# Patient Record
Sex: Female | Born: 1970 | Race: White | Hispanic: No | Marital: Married | Smoking: Never smoker
Health system: Southern US, Community
[De-identification: ages and names within clinical notes are randomized; demographics above are authoritative.]

## PROBLEM LIST (undated history)

## (undated) DIAGNOSIS — J310 Chronic rhinitis: Secondary | ICD-10-CM

## (undated) DIAGNOSIS — Q513 Bicornate uterus: Secondary | ICD-10-CM

## (undated) DIAGNOSIS — G47419 Narcolepsy without cataplexy: Secondary | ICD-10-CM

## (undated) DIAGNOSIS — F329 Major depressive disorder, single episode, unspecified: Secondary | ICD-10-CM

## (undated) DIAGNOSIS — F32A Depression, unspecified: Secondary | ICD-10-CM

## (undated) HISTORY — DX: Narcolepsy without cataplexy: G47.419

## (undated) HISTORY — PX: TONSILLECTOMY: SUR1361

## (undated) HISTORY — PX: GASTRIC BYPASS: SHX52

## (undated) HISTORY — DX: Depression, unspecified: F32.A

## (undated) HISTORY — DX: Chronic rhinitis: J31.0

## (undated) HISTORY — DX: Major depressive disorder, single episode, unspecified: F32.9

---

## 1997-01-20 HISTORY — PX: OTHER SURGICAL HISTORY: SHX169

## 1997-06-09 ENCOUNTER — Ambulatory Visit (HOSPITAL_BASED_OUTPATIENT_CLINIC_OR_DEPARTMENT_OTHER): Admission: RE | Admit: 1997-06-09 | Discharge: 1997-06-09 | Payer: Self-pay | Admitting: Otolaryngology

## 1997-10-04 ENCOUNTER — Ambulatory Visit: Admission: RE | Admit: 1997-10-04 | Discharge: 1997-10-04 | Payer: Self-pay | Admitting: Internal Medicine

## 1997-10-04 ENCOUNTER — Encounter: Payer: Self-pay | Admitting: Internal Medicine

## 1997-10-05 ENCOUNTER — Encounter: Payer: Self-pay | Admitting: Internal Medicine

## 1999-05-13 ENCOUNTER — Other Ambulatory Visit: Admission: RE | Admit: 1999-05-13 | Discharge: 1999-05-13 | Payer: Self-pay | Admitting: Obstetrics and Gynecology

## 1999-05-13 ENCOUNTER — Other Ambulatory Visit: Admission: RE | Admit: 1999-05-13 | Discharge: 1999-05-13 | Payer: Self-pay | Admitting: *Deleted

## 1999-07-10 ENCOUNTER — Emergency Department (HOSPITAL_COMMUNITY): Admission: EM | Admit: 1999-07-10 | Discharge: 1999-07-11 | Payer: Self-pay

## 1999-07-11 ENCOUNTER — Encounter: Payer: Self-pay | Admitting: Emergency Medicine

## 1999-08-15 ENCOUNTER — Other Ambulatory Visit: Admission: RE | Admit: 1999-08-15 | Discharge: 1999-08-15 | Payer: Self-pay | Admitting: Obstetrics and Gynecology

## 1999-08-15 ENCOUNTER — Encounter (INDEPENDENT_AMBULATORY_CARE_PROVIDER_SITE_OTHER): Payer: Self-pay

## 2002-07-07 ENCOUNTER — Emergency Department (HOSPITAL_COMMUNITY): Admission: EM | Admit: 2002-07-07 | Discharge: 2002-07-07 | Payer: Self-pay | Admitting: Emergency Medicine

## 2002-11-03 ENCOUNTER — Inpatient Hospital Stay (HOSPITAL_COMMUNITY): Admission: AD | Admit: 2002-11-03 | Discharge: 2002-11-03 | Payer: Self-pay | Admitting: Obstetrics & Gynecology

## 2004-07-05 ENCOUNTER — Ambulatory Visit: Payer: Self-pay | Admitting: Internal Medicine

## 2004-09-26 ENCOUNTER — Ambulatory Visit: Payer: Self-pay | Admitting: Internal Medicine

## 2007-11-02 ENCOUNTER — Observation Stay (HOSPITAL_COMMUNITY): Admission: AD | Admit: 2007-11-02 | Discharge: 2007-11-03 | Payer: Self-pay | Admitting: Obstetrics and Gynecology

## 2007-11-07 ENCOUNTER — Inpatient Hospital Stay (HOSPITAL_COMMUNITY): Admission: AD | Admit: 2007-11-07 | Discharge: 2007-11-07 | Payer: Self-pay | Admitting: Obstetrics and Gynecology

## 2007-12-26 ENCOUNTER — Inpatient Hospital Stay (HOSPITAL_COMMUNITY): Admission: AD | Admit: 2007-12-26 | Discharge: 2007-12-26 | Payer: Self-pay | Admitting: Obstetrics and Gynecology

## 2007-12-30 ENCOUNTER — Inpatient Hospital Stay (HOSPITAL_COMMUNITY): Admission: AD | Admit: 2007-12-30 | Discharge: 2008-01-03 | Payer: Self-pay | Admitting: Obstetrics and Gynecology

## 2007-12-31 ENCOUNTER — Encounter (INDEPENDENT_AMBULATORY_CARE_PROVIDER_SITE_OTHER): Payer: Self-pay | Admitting: Obstetrics and Gynecology

## 2008-01-04 ENCOUNTER — Encounter: Admission: RE | Admit: 2008-01-04 | Discharge: 2008-02-03 | Payer: Self-pay | Admitting: Obstetrics and Gynecology

## 2008-01-06 ENCOUNTER — Inpatient Hospital Stay (HOSPITAL_COMMUNITY): Admission: AD | Admit: 2008-01-06 | Discharge: 2008-01-07 | Payer: Self-pay | Admitting: Obstetrics and Gynecology

## 2008-11-06 ENCOUNTER — Encounter: Admission: RE | Admit: 2008-11-06 | Discharge: 2008-11-06 | Payer: Self-pay | Admitting: Obstetrics and Gynecology

## 2008-11-21 ENCOUNTER — Ambulatory Visit: Payer: Self-pay | Admitting: Family Medicine

## 2008-11-21 DIAGNOSIS — G47419 Narcolepsy without cataplexy: Secondary | ICD-10-CM

## 2008-11-21 DIAGNOSIS — F329 Major depressive disorder, single episode, unspecified: Secondary | ICD-10-CM

## 2008-12-21 ENCOUNTER — Ambulatory Visit: Payer: Self-pay | Admitting: Family Medicine

## 2009-03-14 ENCOUNTER — Telehealth (INDEPENDENT_AMBULATORY_CARE_PROVIDER_SITE_OTHER): Payer: Self-pay | Admitting: *Deleted

## 2009-05-01 ENCOUNTER — Ambulatory Visit: Payer: Self-pay | Admitting: Family Medicine

## 2009-05-01 DIAGNOSIS — R5383 Other fatigue: Secondary | ICD-10-CM

## 2009-05-01 DIAGNOSIS — R5381 Other malaise: Secondary | ICD-10-CM | POA: Insufficient documentation

## 2009-05-01 DIAGNOSIS — E559 Vitamin D deficiency, unspecified: Secondary | ICD-10-CM | POA: Insufficient documentation

## 2009-05-02 LAB — CONVERTED CEMR LAB
ALT: 22 units/L (ref 0–35)
AST: 20 units/L (ref 0–37)
Albumin: 3.6 g/dL (ref 3.5–5.2)
Eosinophils Relative: 0 % (ref 0.0–5.0)
Folate: 17.4 ng/mL
GFR calc non Af Amer: 99.74 mL/min (ref >60)
HCT: 41 % (ref 36.0–46.0)
Hemoglobin: 14 g/dL (ref 12.0–15.0)
LDL Cholesterol: 88 mg/dL (ref 0–99)
Lymphs Abs: 1.5 10*3/uL (ref 0.7–4.0)
Monocytes Relative: 8.1 % (ref 3.0–12.0)
Neutro Abs: 2.3 10*3/uL (ref 1.4–7.7)
Potassium: 3.9 meq/L (ref 3.5–5.1)
RBC: 4.83 M/uL (ref 3.87–5.11)
Sodium: 145 meq/L (ref 135–145)
TSH: 0.54 microintl units/mL (ref 0.35–5.50)
VLDL: 20.6 mg/dL (ref 0.0–40.0)
WBC: 4.2 10*3/uL — ABNORMAL LOW (ref 4.5–10.5)

## 2009-06-05 ENCOUNTER — Ambulatory Visit: Payer: Self-pay | Admitting: Family Medicine

## 2009-07-03 ENCOUNTER — Ambulatory Visit: Payer: Self-pay | Admitting: Family Medicine

## 2009-07-03 DIAGNOSIS — F988 Other specified behavioral and emotional disorders with onset usually occurring in childhood and adolescence: Secondary | ICD-10-CM | POA: Insufficient documentation

## 2009-07-19 ENCOUNTER — Ambulatory Visit: Payer: Self-pay | Admitting: Internal Medicine

## 2009-07-24 ENCOUNTER — Telehealth (INDEPENDENT_AMBULATORY_CARE_PROVIDER_SITE_OTHER): Payer: Self-pay | Admitting: *Deleted

## 2009-07-24 ENCOUNTER — Telehealth: Payer: Self-pay | Admitting: Family Medicine

## 2009-08-16 ENCOUNTER — Ambulatory Visit (HOSPITAL_BASED_OUTPATIENT_CLINIC_OR_DEPARTMENT_OTHER): Admission: RE | Admit: 2009-08-16 | Discharge: 2009-08-16 | Payer: Self-pay | Admitting: Internal Medicine

## 2009-08-16 ENCOUNTER — Encounter: Payer: Self-pay | Admitting: Internal Medicine

## 2009-08-18 ENCOUNTER — Ambulatory Visit: Payer: Self-pay | Admitting: Internal Medicine

## 2009-08-23 ENCOUNTER — Telehealth (INDEPENDENT_AMBULATORY_CARE_PROVIDER_SITE_OTHER): Payer: Self-pay | Admitting: *Deleted

## 2009-08-30 ENCOUNTER — Ambulatory Visit: Payer: Self-pay | Admitting: Internal Medicine

## 2009-08-30 DIAGNOSIS — G4733 Obstructive sleep apnea (adult) (pediatric): Secondary | ICD-10-CM

## 2009-08-30 DIAGNOSIS — J4520 Mild intermittent asthma, uncomplicated: Secondary | ICD-10-CM

## 2009-09-04 ENCOUNTER — Telehealth (INDEPENDENT_AMBULATORY_CARE_PROVIDER_SITE_OTHER): Payer: Self-pay | Admitting: *Deleted

## 2009-10-01 ENCOUNTER — Telehealth (INDEPENDENT_AMBULATORY_CARE_PROVIDER_SITE_OTHER): Payer: Self-pay | Admitting: *Deleted

## 2009-10-03 ENCOUNTER — Encounter: Payer: Self-pay | Admitting: Internal Medicine

## 2009-10-10 ENCOUNTER — Encounter: Payer: Self-pay | Admitting: Internal Medicine

## 2009-12-07 ENCOUNTER — Telehealth (INDEPENDENT_AMBULATORY_CARE_PROVIDER_SITE_OTHER): Payer: Self-pay | Admitting: *Deleted

## 2009-12-28 ENCOUNTER — Ambulatory Visit: Payer: Self-pay | Admitting: Internal Medicine

## 2009-12-31 ENCOUNTER — Telehealth: Payer: Self-pay | Admitting: Internal Medicine

## 2010-01-01 ENCOUNTER — Ambulatory Visit: Payer: Self-pay | Admitting: Family Medicine

## 2010-02-04 ENCOUNTER — Ambulatory Visit
Admission: RE | Admit: 2010-02-04 | Discharge: 2010-02-04 | Payer: Self-pay | Source: Home / Self Care | Attending: Family Medicine | Admitting: Family Medicine

## 2010-02-13 ENCOUNTER — Other Ambulatory Visit: Payer: Self-pay | Admitting: Dermatology

## 2010-02-15 ENCOUNTER — Ambulatory Visit
Admission: RE | Admit: 2010-02-15 | Discharge: 2010-02-15 | Payer: Self-pay | Source: Home / Self Care | Attending: Family Medicine | Admitting: Family Medicine

## 2010-02-19 NOTE — Progress Notes (Signed)
Summary: Wellbutrin  Phone Note Call from Patient Call back at Home Phone [phone]   Caller: Patient Summary of Call: Pt states that she feels the Wellbutrin works better for her than the Pristiq. Please advise. Army Fossa CMA  July 24, 2009 5:00 PM   Follow-up for Phone Call        ok to start on wellbutrin xl 150  #60  1 by mouth once daily for 1 week then 2 by mouth once daily  wean off pristiq--- 50 mg 1 by mouth once daily for 2 weeks---can give samples Follow-up by: Loreen Freud DO,  July 24, 2009 5:03 PM  Additional Follow-up for Phone Call Additional follow up Details #1::        left message for pt to call back. Army Fossa CMA  July 25, 2009 8:21 AM     Additional Follow-up for Phone Call Additional follow up Details #2::    Spoke with pt she is aware, will put samples up front for pt. Army Fossa CMA  July 25, 2009 11:09 AM   New/Updated Medications: WELLBUTRIN XL 150 MG XR24H-TAB (BUPROPION HCL) 1 by mouth once daily for 1 week then 2 by mouth once daily Prescriptions: WELLBUTRIN XL 150 MG XR24H-TAB (BUPROPION HCL) 1 by mouth once daily for 1 week then 2 by mouth once daily  #60 x 0   Entered by:   Army Fossa CMA   Authorized by:   Loreen Freud DO   Signed by:   Army Fossa CMA on 07/25/2009   Method used:   Electronically to        [facility]       [address]       [place], [place]  [address]       Ph: [phone]       Fax: [phone]   RxID:   [id number]

## 2010-02-19 NOTE — Medication Information (Signed)
Summary: Prior [facility] for Nuvigil/[place] [facility]  Prior [facility] for Nuvigil/[place] [facility]   Imported By: Sherian Rein 10/04/2009 14:40:18  _____________________________________________________________________  External Attachment:    Type:   Image     Comment:   External Document

## 2010-02-19 NOTE — Progress Notes (Signed)
Summary: refill  Phone Note Refill Request Message from:  Patient on July 24, 2009 2:35 PM  Refills Requested: Medication #1:  VYVANSE 70 MG CAPS 1 by mouth once daily patient will pick up [id number]  Initial call taken by: Okey Regal Spring,  July 24, 2009 2:36 PM  Follow-up for Phone Call        Fine for pt to pick up on 07/26/09, but no earlier. Army Fossa CMA  July 24, 2009 2:37 PM     Prescriptions: VYVANSE 70 MG CAPS (LISDEXAMFETAMINE DIMESYLATE) 1 by mouth once daily  #30 x 0   Entered by:   Army Fossa CMA   Authorized by:   Loreen Freud DO   Signed by:   Army Fossa CMA on 07/24/2009   Method used:   Print then Give to Patient   RxID:   [id number]

## 2010-02-19 NOTE — Progress Notes (Signed)
  Phone Note Refill Request   Refills Requested: Medication #1:  ADDERALL 30 MG TABS 1 by mouth two times a day  Follow-up for Phone Call        left message that rx would  be ready tomorrow a.m.Army Fossa CMA  March 14, 2009 3:36 PM     Prescriptions: ADDERALL 30 MG TABS (AMPHETAMINE-DEXTROAMPHETAMINE) 1 by mouth two times a day  #60 x 0   Entered by:   Army Fossa CMA   Authorized by:   Loreen Freud DO   Signed by:   Army Fossa CMA on 03/14/2009   Method used:   Print then Give to Patient   RxID:   [id number]

## 2010-02-19 NOTE — Progress Notes (Signed)
Summary: samples  Phone Note Call from Patient Call back at Home Phone [phone]   Caller: Patient Call For: young Reason for Call: Talk to Nurse Summary of Call: CDY wanted pt to try Nuvigil 250 until next appt.  She comes in next weel  Can she get more samples.  She's out. Initial call taken by: Eugene Gavia,  August 23, 2009 9:23 AM  Follow-up for Phone Call        [facility] to give samples to last until ov next week?  Please advise. Abigail Miyamoto RN  August 23, 2009 9:46 AM   Additional Follow-up for Phone Call Additional follow up Details #1::        Ok to give one sample card of Nuvigil 250 mg, 1 daily as needed. Additional Follow-up by: Waymon Budge MD,  August 23, 2009 1:32 PM    Additional Follow-up for Phone Call Additional follow up Details #2::    LMOM that sample was left at front desk. Abigail Miyamoto RN  August 23, 2009 1:43 PM

## 2010-02-19 NOTE — Progress Notes (Signed)
Summary: prescript  Phone Note Call from Patient   Caller: Patient Call For: young Summary of Call: need prescription for nuvigil . she had samples only [facility] Initial call taken by: Rickard Patience,  July 24, 2009 2:32 PM  Follow-up for Phone Call        Spoke with pt and she states that the nuvigil 150 mg 1 every am.  She states the nuvigil has been helping with alertness but still feels like she has no energy.  She wants to know if she should increase the dose, or try new med? Please advise, thanks! Follow-up by: Vernie Murders,  July 24, 2009 3:18 PM  Additional Follow-up for Phone Call Additional follow up Details #1::        She can pick up samples Nuvigil 250  mg to try one daily before script. i am a little concerned about combining too many meds. She also needs to see if insurance will cover. i won't deal with prior auth. Additional Follow-up by: Waymon Budge MD,  July 25, 2009 8:52 AM    Additional Follow-up for Phone Call Additional follow up Details #2::    samples are out front but pt needs to call ins or get formulary to see if med is covered --cy states no PA will be done--med will not go through on PA--lmtcb   Philipp Deputy [facility]  July 25, 2009 9:46 AM   Pt aware and will need to obtain formulary and bring to her next OV.Michel Bickers [employer]  July 26, 2009 11:43 AM

## 2010-02-19 NOTE — Assessment & Plan Note (Signed)
Summary: NARCOLEPSY W/ OUT CATA PLEXY//kp   Primary Provider/Referring Provider:  Laury Axon  CC:  Sleep Consult-Dr. Laury Axon.  History of Present Illness: July 19, 2009- 40 yoF seen at kind request of Dr Laury Axon. 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 Never successfull before with alerting meds, now including adderall and Vyvanse. In last year, son dx'd liver cancer and she has been stressed and depressed. With that she has put on 50 lbs. Husband tells her she snores, stops breathing.  Naps not refreshing. She tried father's cpap machine and thought it made her feel a little better. Naps don't help but takes more than one daily. Bedtime 7-9PM, then takes 1-2hurs to fall asleep, wakes 3-4 times briefly for bathroom, then up 9-11AM. Questionable sleep paralysis. Denies cataplexy, hynagogic hallucination, limb jerks, sleep walking. Some allergic nasal stufiness aggravates snoring at times. Hx asthma. Denies thyroid disease. Tonsils out.  Preventive Screening-Counseling & Management  Alcohol-Tobacco     Smoking Status: never  Current Medications (verified): 1)  Pristiq 100 Mg Xr24h-Tab (Desvenlafaxine Succinate) .Marland Kitchen.. 1 By Mouth Once Daily 2)  Vyvanse 70 Mg Caps (Lisdexamfetamine Dimesylate) .Marland Kitchen.. 1 By Mouth Once Daily 3)  Abilify 10 Mg Tabs (Aripiprazole) .... 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  The patient denies shortness of breath at rest, productive cough, non-productive cough, coughing up blood, chest pain, irregular heartbeats, acid heartburn, indigestion, loss of appetite, abdominal pain, difficulty swallowing, sore throat, tooth/dental problems, itching, ear ache, anxiety, hand/feet swelling, joint stiffness or pain, rash, change in color of mucus, and fever.    Vital Signs:  Patient profile:   40 year old female Height:      68 inches Weight:      202 pounds BMI:     30.83 O2 Sat:      100 % on Room air Pulse rate:   102 / minute BP sitting:   112 / 78  (right arm) Cuff size:   regular  Vitals Entered By: Reynaldo Minium CMA (July 19, 2009 10:24 AM)  O2 Flow:  Room air CC: Sleep Consult-Dr. 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 Her son's recurrent cancer is the dominant issue in her life. She may benefit from  addition of Nuvigil. Discussed med interaction. She will continue to need naps.  Problem # 2:  ? of OBSTRUCTIVE SLEEP APNEA (ICD-327.23)  As she has gained weight she has snored worse and may have develoeped sleep apnea which would increase her daytime sleepiness. We will get NPSG to assess this.  Medications Added to Medication List This Visit: 1)  Abilify 10 Mg Tabs (Aripiprazole) .... Take 1 by mouth once daily  Other Orders: Consultation Level IV (16109) Sleep Disorder Referral (Sleep Disorder)  Patient Instructions: 1)  Please schedule a follow-up appointment in 1 month. 2)  See [facility] to schedule sleep study 3)  Try sample Nuvigil 150 mg, once daily in the morning. 4)  Copy sent to: Dr Laury Axon

## 2010-02-19 NOTE — Assessment & Plan Note (Signed)
Summary: rto 1 month/cbs   Vital Signs:  Patient profile:   40 year old female Weight:      201.25 pounds Pulse rate:   90 / minute Pulse rhythm:   regular BP sitting:   124 / 80  (left arm) Cuff size:   regular  Vitals Entered By: Army Fossa CMA (July 03, 2009 3:01 PM) CC: Pt here for follow up- states the adipex is not working thinks its due to stress.    History of Present Illness: Pt here for f/u.  She doesn't feel like the adipex is helping.  She states the best she ever felt was when she was on vyvanse and wellbutrin together.   Current Medications (verified): 1)  Pristiq 100 Mg Xr24h-Tab (Desvenlafaxine Succinate) .Marland Kitchen.. 1 By Mouth Once Daily 2)  Vyvanse 70 Mg Caps (Lisdexamfetamine Dimesylate) .Marland Kitchen.. 1 By Mouth Once Daily  Allergies (verified): No Known Drug Allergies  Past History:  Past medical, surgical, family and social histories (including risk factors) reviewed for relevance to current acute and chronic problems.  Past Medical History: Reviewed history from 11/21/2008 and no changes required. Depression   Family History: Reviewed history from 11/21/2008 and no changes required. Family History Breast cancer 1st degree relative <50 Family History Depression  Social History: Reviewed history from 11/21/2008 and no changes required. Married Never Smoked Alcohol use-no Drug use-no G1P1  Review of Systems      See HPI  Physical Exam  General:  Well-developed,well-nourished,in no acute distress; alert,appropriate and cooperative throughout examination Lungs:  Normal respiratory effort, chest expands symmetrically. Lungs are clear to auscultation, no crackles or wheezes. Heart:  normal rate and no murmur.   Psych:  Oriented X3, normally interactive, good eye contact, and tearful.     Impression & Recommendations:  Problem # 1:  DEPRESSIVE DISORDER (ICD-311)  Her updated medication list for this problem includes:    Pristiq 100 Mg Xr24h-tab  (Desvenlafaxine succinate) .Marland Kitchen... 1 by mouth once daily  Problem # 2:  ATTENTION DEFICIT DISORDER, ADULT (ICD-314.00) refill vyvanse 70 mg  Complete Medication List: 1)  Pristiq 100 Mg Xr24h-tab (Desvenlafaxine succinate) .Marland Kitchen.. 1 by mouth once daily 2)  Vyvanse 70 Mg Caps (Lisdexamfetamine dimesylate) .Marland Kitchen.. 1 by mouth once daily Prescriptions: VYVANSE 70 MG CAPS (LISDEXAMFETAMINE DIMESYLATE) 1 by mouth once daily  #30 x 0   Entered and Authorized by:   Loreen Freud DO   Signed by:   Loreen Freud DO on 07/03/2009   Method used:   Print then Give to Patient   RxID:   [id number]

## 2010-02-19 NOTE — Progress Notes (Signed)
Summary: samples  Phone Note Call from Patient Call back at Home Phone [phone]   Caller: Patient Call For: young Summary of Call: Mary [name], pt is waiting to get her ins- should have it in a few weeks but requests samples of nuvigil in the meantime.  Initial call taken by: Tivis Ringer, CNA,  September 04, 2009 12:15 PM  Follow-up for Phone Call        ATC pt-samples at front desk for pick up-unable to leave message as the voicemail is full.Reynaldo Minium CMA  September 04, 2009 2:08 PM   ATC pt at home #.  NA and unable to leave a message- voicemail box is full.  however, i was able to leave a call back # for pt to call back.  Aundra Millet Reynolds LPN  September 04, 2009 3:50 PM    ATC pt at number given; was able to leave our office number via pager system-voicemail full.Reynaldo Minium CMA  September 05, 2009 9:54 AM   Additional Follow-up for Phone Call Additional follow up Details #1::        ATC pt at home #.  NA and unable to leave a message-VM is full and cannot accept new messages at this time however, was able to leave a call back # for the pt via pager.  Gweneth Dimitri RN  September 05, 2009 11:04 AM     Additional Follow-up for Phone Call Additional follow up Details #2::    Pt returned called.  Informed her there are samples of nuvigil at front desk to pick up--she verbalized understanding  Follow-up by: Gweneth Dimitri RN,  September 05, 2009 12:00 PM

## 2010-02-19 NOTE — Assessment & Plan Note (Signed)
Summary: talk about med//lch   Vital Signs:  Patient profile:   40 year old female Weight:      203 pounds BMI:     30.98 Pulse rate:   88 / minute Pulse rhythm:   regular BP sitting:   122 / 80  (left arm) Cuff size:   regular  Vitals Entered By: Army Fossa CMA (Jun 05, 2009 2:01 PM) CC: Pt here to discuss weight loss meds.    History of Present Illness: Pt here to discuss weight loss.  Pt has gained a lot of weight since her son got sick.    Preventive Screening-Counseling & Management  Alcohol-Tobacco     Alcohol drinks/day: 0     Smoking Status: never  Caffeine-Diet-Exercise     Caffeine use/day: 2     Does Patient Exercise: yes     Type of exercise: walking      Times/week: <3      Sexual History:  currently monogamous.    Current Medications (verified): 1)  Adderall 30 Mg Tabs (Amphetamine-Dextroamphetamine) .Marland Kitchen.. 1 By Mouth Two Times A Day 2)  Pristiq 100 Mg Xr24h-Tab (Desvenlafaxine Succinate) .Marland Kitchen.. 1 By Mouth Once Daily 3)  Abilify 5 Mg Tabs (Aripiprazole) .Marland Kitchen.. 1 By Mouth Qd 4)  Adipex-P 37.5 Mg Tabs (Phentermine Hcl) .Marland Kitchen.. 1 By Mouth Qam  Allergies (verified): No Known Drug Allergies  Social History: Does Patient Exercise:  yes Sexual History:  currently monogamous  Physical Exam  General:  Well-developed,well-nourished,in no acute distress; alert,appropriate and cooperative throughout examination Lungs:  Normal respiratory effort, chest expands symmetrically. Lungs are clear to auscultation, no crackles or wheezes. Heart:  normal rate and no murmur.   Psych:  Oriented X3 and normally interactive.     Impression & Recommendations:  Problem # 1:  MORBID OBESITY (ICD-278.01) adipex 1 by mouth once daily  d/w pt diet and exercise  Ht: 68 (12/21/2008)   Wt: 203 (06/05/2009)   BMI: 30.98 (06/05/2009)  Complete Medication List: 1)  Pristiq 100 Mg Xr24h-tab (Desvenlafaxine succinate) .Marland Kitchen.. 1 by mouth once daily 2)  Abilify 5 Mg Tabs (Aripiprazole)  .Marland Kitchen.. 1 by mouth qd 3)  Adipex-p 37.5 Mg Tabs (Phentermine hcl) .Marland Kitchen.. 1 by mouth qam  Other Orders: Pulmonary Referral (Pulmonary)  Patient Instructions: 1)  Please schedule a follow-up appointment in 1 month.  Prescriptions: ADIPEX-P 37.5 MG TABS (PHENTERMINE HCL) 1 by mouth qam  #30 x 0   Entered and Authorized by:   Loreen Freud DO   Signed by:   Loreen Freud DO on 06/05/2009   Method used:   Print then Give to Patient   RxID:   [id number]

## 2010-02-19 NOTE — Progress Notes (Signed)
Summary: Nuvigil PA----APPROVED  Phone Note Outgoing Call   Call placed by: Michel Bickers CMA,  October 01, 2009 10:40 AM Call placed to: [facility] [phone]                                Summary of Call: PA initiated through [facility] at [phone] for Nuvigil. There are no other preferred medications on the patients ins plan. Nuvigil form will be faxed. [place] fax.  Form received and placed on CDY cart.Michel Bickers [facility]  October 01, 2009 11:43 AM Initial call taken by: Michel Bickers CMA,  October 01, 2009 10:42 AM  Follow-up for Phone Call        i did not find form on CDY's cart and spoke with Florentina Addison who has not seen this.  will forward to CDY and Katie for follow up. Boone Master CNA/MA  October 03, 2009 4:48 PM   Approval received today, 10/04/2009 for Nuvigil. Will inform pharmacy and the patient.Michel Bickers [facility]  October 04, 2009 9:30 AM

## 2010-02-19 NOTE — Medication Information (Signed)
Summary: Tax adviser   Imported By: Lehman Prom 10/10/2009 15:58:43  _____________________________________________________________________  External Attachment:    Type:   Image     Comment:   External Document

## 2010-02-19 NOTE — Progress Notes (Signed)
Summary: refill  Phone Note Refill Request Call back at Home Phone [phone] Message from:  Patient on December 07, 2009 12:56 PM  Refills Requested: Medication #1:  VYVANSE 70 MG CAPS 1 by mouth once daily   Dosage confirmed as above?Dosage Confirmed   Supply Requested: 1 month pt would like to pick up around 3 pm if possible. please call her and let her know when it is ready.  Initial call taken by: Lavell Islam,  December 07, 2009 12:57 PM    Prescriptions: VYVANSE 70 MG CAPS (LISDEXAMFETAMINE DIMESYLATE) 1 by mouth once daily  #30 x 0   Entered by:   Almeta Monas CMA (AAMA)   Authorized by:   Loreen Freud DO   Signed by:   Almeta Monas CMA (AAMA) on 12/07/2009   Method used:   Print then Give to Patient   RxID:   [id number]

## 2010-02-19 NOTE — Assessment & Plan Note (Signed)
Summary: TIRED/REQUESTING BLOODWORK/KDC   Vital Signs:  Patient profile:   40 year old female Weight:      197 pounds Pulse rate:   86 / minute Pulse rhythm:   regular BP sitting:   126 / 80  (left arm) Cuff size:   regular  Vitals Entered By: Army Fossa CMA (May 01, 2009 9:55 AM) CC: Pt here requesting bloodwork, does not feel the Pristiq is working.    History of Present Illness: Pt here f/u depression/ anxiety--- she feels the pristiq is not working but has been on several antidepressants in past that have not worked as well.  57 mon old with a form of liver ca---AFP is elevated again.  Pt has been exhausted and is requesting blood work.  She has hx vita D deficiency.    Current Medications (verified): 1)  Adderall 30 Mg Tabs (Amphetamine-Dextroamphetamine) .Marland Kitchen.. 1 By Mouth Two Times A Day 2)  Xanax 1 Mg Tabs (Alprazolam) .Marland Kitchen.. 1 By Mouth Three Times A Day As Needed 3)  Ambien 5 Mg Tabs (Zolpidem Tartrate) 4)  Pristiq 100 Mg Xr24h-Tab (Desvenlafaxine Succinate) .Marland Kitchen.. 1 By Mouth Once Daily 5)  Abilify 5 Mg Tabs (Aripiprazole) .Marland Kitchen.. 1 By Mouth Qd  Allergies (verified): No Known Drug Allergies  Past History:  Past medical, surgical, family and social histories (including risk factors) reviewed for relevance to current acute and chronic problems.  Past Medical History: Reviewed history from 11/21/2008 and no changes required. Depression   Family History: Reviewed history from 11/21/2008 and no changes required. Family History Breast cancer 1st degree relative <50 Family History Depression  Social History: Reviewed history from 11/21/2008 and no changes required. Married Never Smoked Alcohol use-no Drug use-no G1P1  Review of Systems      See HPI  Physical Exam  General:  Well-developed,well-nourished,in no acute distress; alert,appropriate and cooperative throughout examination Psych:  Oriented X3, depressed affect, and tearful.     Impression &  Recommendations:  Problem # 1:  DEPRESSIVE DISORDER (ICD-311)  Her updated medication list for this problem includes:    Xanax 1 Mg Tabs (Alprazolam) .Marland Kitchen... 1 by mouth three times a day as needed    Pristiq 100 Mg Xr24h-tab (Desvenlafaxine succinate) .Marland Kitchen... 1 by mouth once daily  Orders: Venipuncture (16109) TLB-B12 + Folate Pnl (60454_09811-B14/NWG) TLB-Lipid Panel (80061-LIPID) TLB-BMP (Basic Metabolic Panel-BMET) (80048-METABOL) TLB-CBC Platelet - w/Differential (85025-CBCD) TLB-Hepatic/Liver Function Pnl (80076-HEPATIC) TLB-TSH (Thyroid Stimulating Hormone) (84443-TSH) T-Vitamin D (25-Hydroxy) (95621-30865)  Problem # 2:  FATIGUE (ICD-780.79)  Orders: Venipuncture (78469) TLB-B12 + Folate Pnl (62952_84132-G40/NUU) TLB-Lipid Panel (80061-LIPID) TLB-BMP (Basic Metabolic Panel-BMET) (80048-METABOL) TLB-CBC Platelet - w/Differential (85025-CBCD) TLB-Hepatic/Liver Function Pnl (80076-HEPATIC) TLB-TSH (Thyroid Stimulating Hormone) (84443-TSH) T-Vitamin D (25-Hydroxy) (72536-64403)  Problem # 3:  UNSPECIFIED VITAMIN D DEFICIENCY (ICD-268.9)  Orders: Venipuncture (47425) TLB-B12 + Folate Pnl (95638_75643-P29/JJO) TLB-Lipid Panel (80061-LIPID) TLB-BMP (Basic Metabolic Panel-BMET) (80048-METABOL) TLB-CBC Platelet - w/Differential (85025-CBCD) TLB-Hepatic/Liver Function Pnl (80076-HEPATIC) TLB-TSH (Thyroid Stimulating Hormone) (84443-TSH) T-Vitamin D (25-Hydroxy) (84166-06301)  Complete Medication List: 1)  Adderall 30 Mg Tabs (Amphetamine-dextroamphetamine) .Marland Kitchen.. 1 by mouth two times a day 2)  Xanax 1 Mg Tabs (Alprazolam) .Marland Kitchen.. 1 by mouth three times a day as needed 3)  Ambien 5 Mg Tabs (Zolpidem tartrate) 4)  Pristiq 100 Mg Xr24h-tab (Desvenlafaxine succinate) .Marland Kitchen.. 1 by mouth once daily 5)  Abilify 5 Mg Tabs (Aripiprazole) .Marland Kitchen.. 1 by mouth qd Prescriptions: ABILIFY 5 MG TABS (ARIPIPRAZOLE) 1 by mouth qd  #30 x 2   Entered and Authorized by:  Loreen Freud DO   Signed by:    Loreen Freud DO on 05/01/2009   Method used:   Print then Give to Patient   RxID:   [id number] PRISTIQ 100 MG XR24H-TAB (DESVENLAFAXINE SUCCINATE) 1 by mouth once daily  #30 x 5   Entered and Authorized by:   Loreen Freud DO   Signed by:   Loreen Freud DO on 05/01/2009   Method used:   Print then Give to Patient   RxID:   [id number] ADDERALL 30 MG TABS (AMPHETAMINE-DEXTROAMPHETAMINE) 1 by mouth two times a day  #60 x 0   Entered and Authorized by:   Loreen Freud DO   Signed by:   Loreen Freud DO on 05/01/2009   Method used:   Print then Give to Patient   RxID:   [id number]

## 2010-02-19 NOTE — Assessment & Plan Note (Signed)
Summary: rov ///kp   Primary Provider/Referring Provider:  Laury Axon  CC:  follow up visit-still not sleeping good; always tired.Marland Kitchen  History of Present Illness:  July 19, 2009- 38 yoF seen at kind request of Dr Laury Axon. 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 Never successfull before with alerting meds, now including adderall and Vyvanse. In last year, son dx'd liver cancer and she has been stressed and depressed. With that she has put on 50 lbs. Husband tells her she snores, stops breathing.  Naps not refreshing. She tried father's cpap machine and thought it made her feel a little better. Naps don't help but takes more than one daily. Bedtime 7-9PM, then takes 1-2 hours to fall asleep, wakes 3-4 times briefly for bathroom, then up 9-11AM. Questionable sleep paralysis. Denies cataplexy, hynagogic hallucination, limb jerks, sleep walking. Some allergic nasal stufiness aggravates snoring at times. Hx asthma. Denies thyroid disease. Tonsils out.  August 30, 2009- Narcolepsy w/o cataplexy, Hx UPPP, Asthma Tired- her child kept her up last night. Expects some opportunity to nap. Likes Nuvigil 250, better than meds previously tried, but still wishes she could get "more energy". Asks about meds to boost metabolism and I mentioned caffeine. She is asking as much for weight loss purposes as for alertness and we discussed this. NPSG- minimal apnea AHI 5.7/ hr, RDI 12.6. We discussed chin straps, weight loss, side-sleeping and nasal strips as conservative options. An oral appliance may have some potential. Hx of mild intermittent asthma and she asks me to refill rescue inhaler- used infrequently.  Asthma History    Initial Asthma Severity Rating:    Age range: 12+ years    Symptoms: 0-2 days/week    Nighttime Awakenings: 0-2/month    Interferes w/ normal activity: no limitations    SABA use (not for EIB): 0-2 days/week    Asthma Severity Assessment: Intermittent   Preventive Screening-Counseling &  Management  Alcohol-Tobacco     Alcohol drinks/Bernardina Cacho: 0     Smoking Status: never  Current Medications (verified): 1)  Pristiq 100 Mg Xr24h-Tab (Desvenlafaxine Succinate) .Marland Kitchen.. 1 By Mouth Once Daily 2)  Vyvanse 70 Mg Caps (Lisdexamfetamine Dimesylate) .Marland Kitchen.. 1 By Mouth Once Daily 3)  Abilify 10 Mg Tabs (Aripiprazole) .... Take 1 By Mouth Once Daily 4)  Wellbutrin Xl 150 Mg Xr24h-Tab (Bupropion Hcl) .Marland Kitchen.. 1 By Mouth Once Daily For 1 Week Then 2 By Mouth Once Daily 5)  Proventil Hfa 108 (90 Base) Mcg/act Aers (Albuterol Sulfate) .... 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 08/16/09 AHI 5.7/ RDI 12.6 Rhinitis Asthma  Past Surgical History: Tonsillectomy UPPP for OSA- 1999   Review of Systems      See HPI  The patient denies anorexia, fever, weight loss, weight gain, vision loss, decreased hearing, hoarseness, chest pain, syncope, dyspnea on exertion, peripheral edema, prolonged cough, headaches, hemoptysis, abdominal pain, melena, and severe indigestion/heartburn.    Vital Signs:  Patient profile:   40 year old female Height:      68 inches Weight:  207.13 pounds BMI:     31.61 O2 Sat:      98 % on Room air Pulse rate:   82 / minute BP sitting:   114 / 68  (right arm) Cuff size:   regular  Vitals Entered By: Reynaldo Minium CMA (August 30, 2009 10:26 AM)  O2 Flow:  Room air CC: follow up visit-still not  sleeping good; always tired.   Physical Exam  Additional Exam:  General: A/Ox3; pleasant and cooperative, NAD, overweight SKIN: no rash, lesions NODES: no lymphadenopathy HEENT: [place]/AT, EOM- WNL, Conjuctivae- clear, PERRLA, TM-WNL, Nose- clear, Throat- s/p UPPP NECK: Supple w/ fair ROM, JVD- none, normal carotid impulses w/o bruits Thyroid- normal to palpation CHEST: Clear to P&A HEART: RRR, no m/g/r heard ABDOMEN: Soft and nl; ZOX:WRUE, nl pulses, no edema  NEURO: Grossly intact to observation. Does not apear sleepy.      Impression & Recommendations:  Problem # 1:  ? of OBSTRUCTIVE SLEEP APNEA (ICD-327.23)  Minimal apnea now based on current NPSG, with some weight gain and with past hx of palatoplasty. We might be able to justify a trial of CPAP later, based on her RDI. I would prefer to be conservative and have suggested a chin strap and nasal strips as inexpensive options, working on weight.  Problem # 2:  NARCOLEPSY WITHOUT CATAPLEXY (ICD-347.00)  She will continue to nap when able. Meanwhile we will refill Nuvigil.  I discussed occasional, gentle supplementation with caffeine tab if needed.  Problem # 3:  ASTHMA (ICD-493.90) She has hx of asthma and asks we refill her rescue inhaler for appropriate use.  Medications Added to Medication List This Visit: 1)  Proventil Hfa 108 (90 Base) Mcg/act Aers (Albuterol sulfate) .... 2 puffs four times a Yoselyn Mcglade as needed 2)  Nuvigil 250 Mg Tabs (Armodafinil) .Marland Kitchen.. 1 daily if needed  Other Orders: Est. Patient Level IV (45409)  Patient Instructions: 1)  Please schedule a follow-up appointment in 4 months. 2)  Consider trying chin strap and/ or nasal strips if needed to help with minimal sleep apnea and snoring. 3)  Scripts for [employer] and proventil 4)  Consider occasional use of caffeine caplets- NODOZ or store brand Prescriptions: NUVIGIL 250 MG TABS (ARMODAFINIL) 1 daily if needed  #30 x 5   Entered and Authorized by:   Waymon Budge MD   Signed by:   Waymon Budge MD on 08/30/2009   Method used:   Print then Give to Patient   RxID:   [id number] PROVENTIL HFA 108 (90 BASE) MCG/ACT AERS (ALBUTEROL SULFATE) 2 puffs four times a Jazmina Muhlenkamp as needed  #1 x prn   Entered and Authorized by:   Waymon Budge MD   Signed by:   Waymon Budge MD on 08/30/2009   Method used:   Print then Give to Patient   RxID:   [id number]

## 2010-02-21 NOTE — Assessment & Plan Note (Signed)
Summary: flu shot//lch  Nurse Visit Flu Vaccine Consent Questions     Do you have a history of severe allergic reactions to this vaccine? no    Any prior history of allergic reactions to egg and/or gelatin? no    Do you have a sensitivity to the preservative Thimersol? no    Do you have a past history of Guillan-Barre Syndrome? no    Do you currently have an acute febrile illness? no    Have you ever had a severe reaction to latex? no    Vaccine information given and explained to patient? yes    Are you currently pregnant? no    Lot Number:[id number]   Exp Date:07/20/2010   Site Given  Left Deltoid IM   Allergies: No Known Drug Allergies  Orders Added: 1)  Admin 1st Vaccine [90471] 2)  Flu Vaccine 80yrs + [69629]

## 2010-02-21 NOTE — Assessment & Plan Note (Signed)
Summary: 6 MONTH RTO/KN   Vital Signs:  Patient profile:   40 year old female Weight:      198.8 pounds Pulse rate:   80 / minute Pulse rhythm:   regular BP sitting:   102 / 64  (right arm) Cuff size:   large  Vitals Entered By: Almeta Monas CMA Duncan Dull) (February 04, 2010 10:40 AM) CC: 6 mo f/u on meds---c/o increased stress and headaches   History of Present Illness: Pt here to f/u meds.  Pt states she is still very depressed.  She is only taking wellbutrin xl 150 mg.   Her son's cancer is back and she has been under a lot of stress.    Current Medications (verified): 1)  Vyvanse 70 Mg Caps (Lisdexamfetamine Dimesylate) .Marland Kitchen.. 1 By Mouth Once Daily 2)  Wellbutrin Xl 300 Mg Xr24h-Tab (Bupropion Hcl) .Marland Kitchen.. 1 By Mouth Once Daily 3)  Proventil Hfa 108 (90 Base) Mcg/act Aers (Albuterol Sulfate) .... 2 Puffs Four Times A Day As Needed 4)  Xanax 1 Mg Tabs (Alprazolam) .Marland Kitchen.. 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 08/16/09 AHI 5.7/ RDI 12.6 Rhinitis Asthma  Past Surgical History: Last updated: 08/30/2009 Tonsillectomy UPPP for OSA- 1999   Family History: Last updated: 07/19/2009 Family History Breast cancer 1st degree relative <50 Family History Depression Son 35 months old- hepatoblastoma  Social History: Last updated: 11/21/2008 Married Never Smoked Alcohol use-no Drug use-no G1P1  Risk Factors: Alcohol Use: 0 (08/30/2009) Caffeine Use: 2 (06/05/2009) Exercise: yes (06/05/2009)  Risk Factors: Smoking Status: never (08/30/2009)  Family History: Reviewed history from 07/19/2009 and no changes required. Family History Breast cancer 1st degree relative <50 Family History Depression Son 30 months old- hepatoblastoma  Social History: Reviewed history from 11/21/2008 and no  changes required. Married Never Smoked Alcohol use-no Drug use-no G1P1  Review of Systems      See HPI  Physical Exam  General:  Well-developed,well-nourished,in no acute distress; alert,appropriate and cooperative throughout examination Psych:  Oriented X3, normally interactive, good eye contact, not anxious appearing, not depressed appearing, and not suicidal.     Impression & Recommendations:  Problem # 1:  DEPRESSIVE DISORDER (ICD-311)  The following medications were removed from the medication list:    Pristiq 100 Mg Xr24h-tab (Desvenlafaxine succinate) .Marland Kitchen... 1 by mouth once daily Her updated medication list for this problem includes:    Wellbutrin Xl 300 Mg Xr24h-tab (Bupropion hcl) .Marland Kitchen... 1 by mouth once daily    Xanax 1 Mg Tabs (Alprazolam) .Marland Kitchen... 1 by mouth three times a day as needed  Complete Medication List: 1)  Vyvanse 70 Mg Caps (Lisdexamfetamine dimesylate) .Marland Kitchen.. 1 by mouth once daily 2)  Wellbutrin Xl 300 Mg Xr24h-tab (Bupropion hcl) .Marland Kitchen.. 1 by mouth once daily 3)  Proventil Hfa 108 (90 Base) Mcg/act Aers (Albuterol sulfate) .... 2 puffs four times a day as needed 4)  Xanax 1 Mg Tabs (Alprazolam) .Marland Kitchen.. 1 by mouth three times a day as needed  Patient Instructions: 1)  Please schedule a follow-up appointment in 6 months .  Prescriptions: XANAX 1 MG TABS (ALPRAZOLAM) 1 by mouth three times a day as needed  #60 x 1   Entered and Authorized by:   Loreen Freud DO   Signed by:   Loreen Freud DO on 02/04/2010   Method  used:   Print then Give to Patient   RxID:   [id number] VYVANSE 70 MG CAPS (LISDEXAMFETAMINE DIMESYLATE) 1 by mouth once daily  #30 x 0   Entered and Authorized by:   Loreen Freud DO   Signed by:   Loreen Freud DO on 02/04/2010   Method used:   Print then Give to Patient   RxID:   [id number] WELLBUTRIN XL 300 MG XR24H-TAB (BUPROPION HCL) 1 by mouth once daily  #30 x 5   Entered and Authorized by:   Loreen Freud DO   Signed by:   Loreen Freud  DO on 02/04/2010   Method used:   Electronically to        [facility]       [address]       [place], [place]  [address]       Ph: [phone]       Fax: [phone]   RxID:   [id number]    Orders Added: 1)  Est. Patient Level III [29937]

## 2010-02-21 NOTE — Progress Notes (Signed)
Summary: nos appt  Phone Note Call from Patient   Caller: [email or web address]  Call For: young Summary of Call: LMTCB x2 to rsc nos from 12/9. Initial call taken by: Darletta Moll,  December 31, 2009 3:35 PM

## 2010-02-26 ENCOUNTER — Telehealth: Payer: Self-pay | Admitting: Family Medicine

## 2010-03-07 NOTE — Progress Notes (Signed)
Summary: med increase  Phone Note Refill Request Call back at Home Phone [phone] Message from:  Patient  Refills Requested: Medication #1:  WELLBUTRIN XL 300 MG XR24H-TAB 1 by mouth once daily Pt called to report that med is working but feels that she may just need something a little stronger. Pt notes that med is working but on some days she is just needing a little extra to help her thru the day because of her current situation with son. [facility] uses [facility]. Pls advise.........Marland KitchenFelecia Deloach CMA  February 26, 2010 11:45 AM    Follow-up for Phone Call        wellbutrin xl 150 mg  3 by mouth once daily #90  2 refills Follow-up by: Loreen Freud DO,  February 26, 2010 11:53 AM  Additional Follow-up for Phone Call Additional follow up Details #1::        voicemail left advising patient Rx sent to the pharmacy.... Almeta Monas CMA (AAMA)  February 26, 2010 1:23 PM     New/Updated Medications: WELLBUTRIN XL 150 MG XR24H-TAB (BUPROPION HCL) 3 by mouth once daily Prescriptions: WELLBUTRIN XL 150 MG XR24H-TAB (BUPROPION HCL) 3 by mouth once daily  #90 x 2   Entered by:   Almeta Monas CMA (AAMA)   Authorized by:   Loreen Freud DO   Signed by:   Almeta Monas CMA (AAMA) on 02/26/2010   Method used:   Faxed to ...       [facility]       [address]       [place], [place]  [address]       Ph: [phone]       Fax: [phone]   RxID:   [id number]

## 2010-03-26 ENCOUNTER — Telehealth: Payer: Self-pay | Admitting: Family Medicine

## 2010-04-02 NOTE — Progress Notes (Signed)
Summary: med concerns  Phone Note Refill Request Call back at Home Phone [phone]   Refills Requested: Medication #1:  WELLBUTRIN XL 150 MG XR24H-TAB 3 by mouth once daily Pt would like to know if she can increase to 300mg  instead of taking 2 tab daily......Marland KitchenFelecia Deloach CMA  March 26, 2010 3:17 PM    Follow-up for Phone Call        yes wellbutrin xl 300mg  #30  1 by mouth once daily    5 refills Follow-up by: Loreen Freud DO,  March 26, 2010 3:36 PM    New/Updated Medications: WELLBUTRIN XL 300 MG XR24H-TAB (BUPROPION HCL) 1 by mouth once daily Prescriptions: WELLBUTRIN XL 300 MG XR24H-TAB (BUPROPION HCL) 1 by mouth once daily  #30 x 5   Entered by:   Almeta Monas CMA (AAMA)   Authorized by:   Loreen Freud DO   Signed by:   Almeta Monas CMA (AAMA) on 03/26/2010   Method used:   Faxed to ...       [facility]       [address]       [place], [place]  [address]       Ph: [phone]       Fax: [phone]   RxID:   [id number]

## 2010-04-03 ENCOUNTER — Telehealth (INDEPENDENT_AMBULATORY_CARE_PROVIDER_SITE_OTHER): Payer: Self-pay | Admitting: *Deleted

## 2010-04-09 NOTE — Progress Notes (Signed)
Summary: refill  Phone Note Refill Request Call back at Home Phone [phone]   Refills Requested: Medication #1:  VYVANSE 70 MG CAPS 1 by mouth once daily Left Pt detail message Rx ready for pick-up in am...........Marland KitchenFelecia Deloach CMA  April 03, 2010 12:52 PM      Prescriptions: VYVANSE 70 MG CAPS (LISDEXAMFETAMINE DIMESYLATE) 1 by mouth once daily  #30 x 0   Entered by:   Jeremy Johann CMA   Authorized by:   Loreen Freud DO   Signed by:   Jeremy Johann CMA on 04/03/2010   Method used:   Print then Give to Patient   RxID:   [id number]

## 2010-05-06 ENCOUNTER — Other Ambulatory Visit: Payer: Self-pay | Admitting: *Deleted

## 2010-05-06 MED ORDER — LISDEXAMFETAMINE DIMESYLATE 70 MG PO CAPS: 70.0000 mg | ORAL_CAPSULE | ORAL | Status: DC

## 2010-05-06 NOTE — Telephone Encounter (Signed)
Pt aware Rx ready for pick up 

## 2010-06-04 NOTE — Discharge Summary (Signed)
NAMECHANTERIA, HAGGARD               ACCOUNT NO.:  [id number]   MEDICAL RECORD NO.:  [id number]          PATIENT TYPE:  INP   LOCATION:  9320                          FACILITY:  [facility]   PHYSICIAN:  Janine Limbo, M.D.DATE OF BIRTH:  [birth year]   DATE OF ADMISSION:  12/30/2007  DATE OF DISCHARGE:  01/03/2008                               DISCHARGE SUMMARY   Mr. Mary [name] is a 40 year old G2, P 0-1-1-1 status post 3 days of primary  cesarean at 33 weeks for premature rupture of membranes and  nonreassuring fetal heart rate.  She was admitted on December 30, 2007,  with spontaneous rupture of membranes.  She was delivered on December 31, 2007, and she is being discharged on January 03, 2008.  Baby boy,  [name], was delivered on December 31, 2007, at 2126, weighing 4 pounds 2  ounces and have Apgars of 8 at 1 minute and 9 at 5 minutes.  He was  taken straight by the NICU team to the NICU.  Ms. Capers followed the  cesarean clinical pathway of care in the adult ICU and progressed very  well with her pain adequately controlled, tolerating activity well,  tolerating diet well, positive flatus, and pumping her breasts to feed  the baby and establish her milk supply.  She did have a problem with  residual numbness in both of her lower extremities and was evaluated by  the anesthesiologist who states to just watch that for the next 14 days  and see if it does not resolve, and if it does, then to do another  Anesthesiology consult.  Postop, her iron did drop to a hemoglobin of  10.9, and she already has iron at home, so we are just going to resume  that.  Her plans for birth control are Mirena and we have encouraged her  to make an appointment for 4-week visit in the office for Mirena  insertion workup.   PREOPERATIVE DIAGNOSES:  1. Intrauterine pregnancy at 33 weeks.  2. Bicornate uterus.  3. Premature preterm rupture of membranes.  4. Preterm labor and nonreassuring fetal heart rate.   CURRENT DIAGNOSES:  Stable postoperative cesarean anemia without  symptoms and of course she still has a bicornate uterus.   PROCEDURE DONE:  Cesarean delivery and epidural anesthesia.   She was deemed to have received full benefit of her stay at the [facility] and she is going home with prescriptions for Darvocet and  Motrin and plans to continue the prenatal vitamins and iron supplements  that she already has and plans to continue to pump or to bring in milk  for her son and followup with lactation specialist p.r.n.      Eulogio Bear, CNM      ______________________________  Janine Limbo, M.D.   JM/MEDQ  D:  01/03/2008  T:  01/04/2008  Job:  [id number]

## 2010-06-04 NOTE — H&P (Signed)
NAMEJULAINE, [name]               ACCOUNT NO.:  [id number]   MEDICAL RECORD NO.:  [id number]          PATIENT TYPE:  INP   LOCATION:  9165                          FACILITY:  [facility]   PHYSICIAN:  Janine Limbo, M.D.DATE OF BIRTH:  [birth year]   DATE OF ADMISSION:  12/30/2007  DATE OF DISCHARGE:                              HISTORY & PHYSICAL   Mary [name] is a 40 year old gravida 2, para 0-0-1-0 at 32-6/7 weeks who  presents from the office status post spontaneous rupture of membranes  there in the course of a routine visit.  She is noted to be leaking  clear fluid.  She denied any regular uterine contractions.  She had an  ultrasound at the office today showing an estimated fetal weight of 4  pounds 5 ounces, amniotic fluid index still at the 25th percentile, and  vertex presentation.  Group B strep culture was negative on December 6.  GC and Chlamydia cultures were negative that same day from a maternity  admissions unit visit.  Her pregnancy has been remarkable for (1)  bicornate uterus, (2) history of positive fetal fibronectin on  approximately October 13, she received a betamethasone course at that  time, she then had had negative fetal fibronectin on November 30 and  December 6, (3) advanced maternal age with a normal first trimester  screen, (4)  history of depression, the patient has been off Wellbutrin  since before pregnancy, (5) asthma, with no routine medication use, (6)  marginal insertion of cord.   PRENATAL LABS:  Blood type is O+, Rh antibody negative, VDRL  nonreactive, rubella titer positive, hepatitis B surface antigen  negative, HIV was nonreactive.  She declined cystic fibrosis testing.  Hemoglobin was 15.4 at new OB  and it was within normal limits at 28  weeks.  Glucola was 90.  She had negative first trimester screen.  She  had a positive fetal fibronectin done at 25 weeks.  Fetal fibronectin  was repeated at 27 weeks and was negative.  Repeated again on  November  30 and then again on December 6, all were negative.  Group B strep  culture and GC and Chlamydia cultures were negative on December 6.   HISTORY OF PRESENT PREGNANCY:  The patient entered care at approximately  9 weeks.  She had had the previously identified bicornuate uterus.  She  did plan first trimester screen.  She declined amnio but had a normal  first trimester screening.  She had been on Wellbutrin prior to  pregnancy but had discontinued it just prior to that and remained off of  it throughout her whole pregnancy.  Bicornuate uterus was noted to have  the fetus growing in the left uterine horn.  She had an ultrasound at 9  weeks with an [facility] of February 18, 2008 confirmed.  Prenatal labs were  done at the first visit.  HSV-1 and 2 were done and were negative.  Cervix had normal length of 4.43 cm at 12-13 weeks.  She was on Allegra  for allergies.  She was still having some reflux and nausea and was  placed on Zofran and Zantac at 16 weeks for that.  She had another  ultrasound at 20 weeks for anatomy, anterior placenta was noted.  Cervical length was 3.54.  All anatomy was seen and was normal.  Hemoglobin was 11.4 at 20 weeks, was placed on iron.  She had some  leaking at 23 weeks.  Negative sterile speculum exam was noted.  She had  negative cultures at that time.  She was sent to maternity admissions  unit at 24 weeks secondary to abdominal pain.  At this time she was  noted to have a short cervix and a positive fetal fibronectin.  She was  admitted on October 13 and had a 24-hour observation, received  betamethasone.  She was placed on terbutaline 4 times daily.  The cervix  at that time was closed, 30%, with the presenting part high.  Fetal  fibronectin was repeated at 27 weeks and was negative.  She had another  ultrasound at 28 weeks showing normal growth and normal fluid, cervical  length was 3.18.  She had some right ear pain at that time and was  placed on  Auralgan otic drops.  At 30 weeks she was doing well.  She was  taking her terbutaline essentially p.r.n.  She then was seen today for a  routine visit.  She went to the bathroom to weigh herself and  spontaneous rupture of membranes occurred.  Clear fluid was noted.  She  had an ultrasound at the office subsequent to that, still showing 25th  percentile of fluid with an AFI of 11.5.  Estimated fetal weight was 4  pounds 3 ounces.  Fetus was vertex, cervical length was noted to be 3.06  cm in length.  She is therefore admitted to [facility] for  premature rupture of membranes.   OBSTETRICAL HISTORY:  In 2006 she had a miscarriage at 12 weeks and did  not require a D and C.   MEDICAL HISTORY:  She reports the previously noted bicornuate uterus.  She reports usual childhood illnesses.  She had vein stripping 2-3 years  ago.  She has asthma and has been on Proventil and is followed by an  allergist, but has had no requirement for medication during her  pregnancy.  She had a UTI in February of 2009.  She was diagnosed with  depression in 2007, she was on Wellbutrin prior to pregnancy and managed  by her family doctor.  She had a respiratory arrest under anesthesia at  age 7 when she had a tonsillectomy and adenoidectomy.  She has no known  medication allergies.   FAMILY HISTORY:  Her father had an MI.  Her father has hypertension.  Her niece had seizures.  Her mother has a history of depression.   SURGICAL HISTORY:  Includes vein stripping in 2006, tonsillectomy and  adenoidectomy at age 73 when she had the previously noted respiratory  arrest under anesthesia.  She had nasal bone surgery at age 15.   GENETIC HISTORY:  Remarkable for the patient's age of 57.  Father of  baby has a cousin with a cleft lip.  The patient's sister has twins.  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   SOCIAL HISTORY:  The patient is married to the father of the baby.  He   is involved and supportive.  His name is Mary [name].  The patient is  college educated.  She is a Dietitian.  Her husband  has 11th grade  education.  He is a Psychologist, occupational.  She has been followed by the physician  service [facility].  She denies any alcohol, drug or tobacco  use during this pregnancy.   PHYSICAL EXAM:  VITAL SIGNS: Stable.  The patient is afebrile.  HEENT:  Within normal limits.  LUNGS:  Breath sounds are clear.  HEART:  Regular rate and rhythm without murmur.  BREASTS:  Soft and nontender.  ABDOMEN:  Fundal height is approximately 32-33 cm.  Estimated fetal  weight is 4 pounds 3 ounces by ultrasound.  The patient is noted be  leaking clear fluid.  Fetal heart rate is reassuring, although it is  somewhat difficult to trace due to uterine configuration, baseline is in  the 140s.  No decelerations are noted.  There are no contractions noted  at present, but the patient is aware of some sporadic uterine  contractions and pressure.  Cervix is long and closed at the office.  Group B strep is negative.  EXTREMITIES:  Deep tendon reflexes are 2+ without clonus.  There is a  trace edema noted.   IMPRESSION:  1. Intrauterine pregnancy at 32-6/7 weeks.  2. Premature rupture of membranes.  3. Bicornuate uterus.  4. Status post betamethasone at 25 weeks.  5. Group B streptococcus negative.   PLAN:  1. Admit to birthing suite for consult with Dr. Stefano Gaul as attending      physician secondary to no antenatal beds available.  2. Routine antenatal orders.  3. Will defer any further or additional betamethasone and no      tocolytics will be given at present.  4. NICU consult will be obtained.  5. Reviewed with the patient concerns regarding premature rupture of      membranes with the information      communicated to the patient that this could be a prolonged      hospitalization, although labor may ensue in the short term.  6. MDs will follow.      Renaldo Reel  Emilee Hero, C.N.M.      Janine Limbo, M.D.  Electronically Signed    VLL/MEDQ  D:  12/30/2007  T:  12/30/2007  Job:  [id number]

## 2010-06-04 NOTE — H&P (Signed)
[name], [name]               ACCOUNT NO.:  [id number]   MEDICAL RECORD NO.:  [id number]          PATIENT TYPE:  MAT   LOCATION:  MATC                          [facility]:  [facility]   PHYSICIAN:  Mary [name], M.D.   DATE OF BIRTH:  May 29, 1970   DATE OF ADMISSION:  11/02/2007  DATE OF DISCHARGE:  11/02/2007                              HISTORY & PHYSICAL   [name] is a 40 year old single white female, gravida 2, para 0-0-[name]-  0, at 67 and 5/7 weeks per an Mary [name] of February 18, 2008, who was sent from  the office for a preterm labor evaluation.  She presented to our office  earlier today with a chief complaint of acute bloody mucus this morning  following 2 days of some cramping and some sharp intermittent right  lower quadrant pain.  She denies any leakage of fluid, nausea, vomiting,  diarrhea, PIH or UTI signs or symptoms, fever, cough or shortness of  breath.  She does report good fetal movement.  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  She did report having  some lower abdominal suprapubic kind of pressure.  She has been followed  at [name].  Her history has been remarkable for the following:  [name]. A bicornate uterus with pregnancy in left horn.  2. Advanced maternal age.  3. Depression, from which she has been stable off her Wellbutrin      during the pregnancy.  4. Asthma.  5. Family history of cleft lip.   PRENATAL LABORATORIES:  Blood type is O positive, HIV negative.  She  declined cystic fibrosis.  Hemoglobin was 15.4 at her new OB and  hematocrit 44.5.  Her platelets were 277.  Gonorrhea and Chlamydia  cultures were negative June 2009.   OBSTETRICAL HISTORY:  Mary [name] was a spontaneous abortion at 12 weeks,  no complications, passed spontaneously in 2006.  Gravida 2 is current  pregnancy.   PAST MEDICAL HISTORY:  She denies medication or latex allergy.   She  reports menarche at 38-75 years of age with monthly cycle 4-5 days of  flow with significant cramping.  She was certain of her LMP of May 14, 2007, giving her an Mary [name] of February 18, 2008.  History remarkable for  bicornate uterus.  She does recall varicella as a child.  She had vein  stripping in 2006; asthma, for which she is followed by an allergist and  takes Proventil p.r.n.  She had cystitis February 2009.  Depression  diagnosed in 2007, followed by Mary [name], and had previously been on  Wellbutrin.   PAST SURGICAL HISTORY:  Also remarkable for tonsils and adenoids at age  40.  I do not know if it was a rhinoplasty, but there was some kind of  nasal bone surgery at age 9.  Also note she had respiratory arrest under  anesthesia at age 32 with the tonsils and adenoids.   GENETIC HISTORY:  Remarkable in that  patient is 42 years old.  Father of  the baby's cousin cleft lip.  Patient's sister has twins.  Patient had  malformed nasal bone.   SOCIAL HISTORY:  Single white female, nondenominational religion.  Baby's father's name is Mary [name].  He is involved and supportive.  Patient has had 16 years of education and is a full-time Dietitian.  Father of baby has had 11 years of education and is full-time Psychologist, occupational.  Patient denied alcohol, tobacco or illicit drug use.  She only takes a  prenatal vitamin.   HISTORY OF PRESENT PREGNANCY:  She entered care July 13, 2007, for new  OB interview.  She returned for a new OB workup July 19, 2007.  She was  approximately 9 weeks' pregnant.  Her weight that day was 190 pounds.  Height is 5 feet 8 inches.  She was not sure of her pre-gravid weight  exactly.  She did voice desire that she was interested in Mary [name] care but  will further discuss.  She had an ultrasound that day--single  intrauterine pregnancy at 9 weeks and 3 days, was consistent with an EDC  per her LMP; did note bicornate uterus.  She declined amniocentesis at  that time but  did desire first trimester screen, voiced that her  depression was stable on her Wellbutrin at her new OB workup.  Planned  MD visit to discuss the bicornate uterus with MDs.  Plan was made for  her to have cervical length at her next ultrasound.  She did have HSV-[name]  and HSV-2 cultures done that were negative.  Patient met with Dr.  Pennie [name] on August 10, 2007.  She did report some cramping, told Dr.  Pennie [name] that she was doing well off her Wellbutrin.  Her weight that day  was 195 pounds.  First trimester screen was normal and cervical length  at that time was 4.43 cm at her first trimester screen, bicornate  uterus, and she was taking Allegra for her allergies with good relief.  The first trimester screen was within normal limits.  At 15 and 6/7  weeks, patient complained of continued reflux and nausea.  She was  prescribed Zofran as well as encouraged to take Zantac.  She had  ultrasound at 19 and 6/7 weeks.  It was her anatomy; single intrauterine  pregnancy with size equal to dates.  Her cervical length at that time  was 3.54, regular rate and rhythm for her fetus, anterior placenta,  normal fluid, suggestive female.  All anatomy was seen and no anomalies  were observed.  __________ risk was [name] out of [id number].  Her adjusted risk  was [name] out of [id number].  Heartburn continued.  She was eating Tums several  times a day, and she was prescribed Protonix at that time.  At that  time, she was unsure regarding quickening and discussed could be  possibly related to the anterior placenta.  Patient's pregnancy has  continued to progress until she presented today at the office with the  above complaints.   OBJECTIVE:  VITAL SIGNS:  Blood pressure 139/63, heart rate 91,  respirations 18 and temperature 98.8.  Fetal heart rate in the 150s,  difficult to trace but appropriate for gestational age tocolysis  palpation.  She had moderate contractions, mainly in left upper quadrant  around fundus.  Irritability was  noted after toco was adjusted around  1730 hours.  They did resolve, status post terbutaline at 1743 hours.   PHYSICAL EXAMINATION:  GENERAL:  In no acute distress.  She is alert and  oriented x3 and very pleasant.  HEENT:  Grossly intact and within normal limits.  CARDIOVASCULAR:  Regular rate and rhythm without murmur.  LUNGS:  Clear to auscultation bilaterally.  ABDOMEN:  Soft, nontender, gravid, no rebound, no guarding.  PELVIC:  Sterile spec revealed no vaginal bleeding.  She did have some  stringy mucus at external os.  There were no abnormalities or lesions.  Cervical exam:  Cervix was closed.  It was about 30% to 40% effaced, -2  station.  EXTREMITIES:  Within normal limits.   LABORATORIES:  White count is 12.4, hemoglobin 13.[name], hematocrit 38.6,  platelets 288.  Differential showed elevated neutrophils at 85th  percentile.  Absolute granulocytes were also high at 10.6, and  lymphocytes were low equal to 11.  GBS, GC and Chlamydia cultures are  pending.  A urine C and S is also pending.  A UA was within normal  limits; just to note, specific gravity was less than [name].005.  Wet prep  was negative.  She did have a complete OB ultrasound; single  intrauterine pregnancy; size was consistent with previous dating,  cephalic presentation, normal fluid, anterior placenta with marginal  cord insertion, estimated fetal weight 799 grams which is [name] pound 12  ounces and 68th percentile.  The anatomy that was visualized was all  normal, and concordant growth was noted.  Transvaginal cervical length  was short and equal to 2.[name] cm but was closed and female gender.   IMPRESSION:  [name]. Intrauterine pregnancy at 24 and 5/7 weeks.  2. Bicornate uterus with pregnancy in left horn.  3. Uterine irritability with good response after terbutaline      subcutaneously at 1743 hours.  4. Short cervix.  5. Slightly increased white blood cells.   PLAN:  [name]. Consulted with Dr. Su Hilt and will admit for  23-hour observation      with Dr. Su Hilt as attending physician for continued monitoring      and for preterm labor.  2. We plan bed rest with bathroom privileges.  Continue as toco with      intermittent fetal heart rate monitoring.  She has already received      her first dose of betamethasone IM and will repeat that dose in 24      hours.  We also plan fetal fibronectin around 1800 hours tomorrow,      November 03, 2007.  We will continue to observe closely, and MD is      to follow.      Candice Denny Levy, Mary [name]      ______________________________  Mary [name], M.D.    CHS/MEDQ  D:  11/02/2007  T:  11/02/2007  Job:  [id number]

## 2010-06-04 NOTE — H&P (Signed)
NAMEPIETRA, ZULUAGA               ACCOUNT NO.:  [id number]   MEDICAL RECORD NO.:  [id number]          PATIENT TYPE:  INP   LOCATION:  9307                          [facility]  [facility]   PHYSICIAN:  Osborn Coho, M.D.   DATE OF BIRTH:  Sep 16, 1970   DATE OF ADMISSION:  01/06/2008  DATE OF DISCHARGE:                              HISTORY & PHYSICAL   Mary [name] is a 40 year old gravida 2, para 0-1-1-1 who presents today  6 days postop from a primary cesarean for nonreassuring fetal heart rate  at 33 weeks with sudden onset of shortness of breath occurring this  morning around 5 a.m.  Her CBC this morning included a white blood cell  count of 8.1, hemoglobin of 12.0, hematocrit of 35.8 and a platelet  count of 333,000.  Her metabolic panel was all within normal limits for  sodium, potassium, chloride, carbon dioxide, glucose, BUN and creatinine  and had slightly low levels of calcium, protein and albumin.  Her SGOT  and SGPT were normal as was her alkaline phosphatase and bilirubin.  Her  LDH was normal.  Her uric acid was normal.  Her O2 sat upon arrival to  the emergency room was 92% on room air.  Her vital signs were within  normal limits and she is noted to be normotensive.  Her ABG showed a pH  of 7.446, pCO2 of 34.7, pO2 of 51.9, bicarb 23.5, pCO2 24.6, BE 0.4.  Her chest x-ray showed pulmonary edema with pleural effusions and her  spiral CT showed bilateral patchy air space infiltrates with moderate  sized pleural effusions with a favor of pneumonia over pulmonary edema.  Pulmonary embolism was ruled out.  Her admitting diagnosis is shortness  of breath postoperatively 6 days past primary cesarean.   MEDICATION ALLERGIES:  Mary [name] does have a medication sensitivity to  Percocet which causes itching in her.   CURRENT MEDICATIONS:  Include Motrin, Darvocet and prenatal vitamins.   OBSTETRICAL HISTORY:  Gravida 1 resulted in a miscarriage at [redacted] weeks  gestation in 2006, gravida 2  was a pregnancy was just ended on December  11 with primary cesarean at 33 weeks for premature rupture of membranes  and nonreassuring fetal heart rate.  Her recent pregnancy was  complicated by the fact of having a bicornate uterus.   MEDICAL HISTORY:  Mary [name] does have a bicornuate uterus as just  mentioned.  She has a history of asthma and has been on Proventil and  been followed by an allergist.  She did not have any problems during her  pregnancy with her asthma.  She had a urinary tract infection in  February of 2009.  She was diagnosed with depression in 2007 and was on  Wellbutrin prior to pregnancy which was managed by her family doctor.  She did have a respiratory arrest under anesthesia at age 89 when she  had a tonsillectomy and adenoidectomy.   FAMILY HISTORY:  Her father had a cardiac arrest and he has  hypertension.  Her niece has seizures.  Her mother has a history of  depression.  PAST SURGICAL HISTORY:  She had vein stripping in 2006, a T and A ate  age 36, nasal bone surgery at age 53 and a cesarean earlier this month.   SOCIAL HISTORY:  She is married to [employer].  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  Her husband has some high school  and works as a Psychologist, occupational.  She has been followed by the physician service  at [facility].  She denies alcohol, drug or tobacco use.   HISTORY OF PRESENT ILLNESS:  Mary [name] states that she was awakened in  the middle of the night with shortness of breath that proceeded to the  point where she was unable to talk.  This happened around 3:30 in the  morning.  She said she did not have any chest pain.  She was not  coughing up any blood.  Following her surgery on December 11 she did  have some anemia with a hemoglobin of 10.9 after surgery and some  bilateral lower extremity edema but no signs of symptoms of preeclampsia  such as headache, visual changes, chest pain, epigastric pain.  Upon  arriving this  morning her O2 sats were 92-95% on room air.  She had  crackles and wheezes in all lobes with some air exchange but diminished  breath sounds.  Her heart had a regular rate and rhythm without murmurs.  Her abdominal incision was healing nicely.  Fundus is firm below the  midline below the umbilicus.  Her abdomen was soft and nontender with  normoactive bowel sounds.  Her extremities had +3 DTR with 1 beat of  clonus on the right and a +2 edema.  Pelvic exam was deferred.  The  patient reports minimal bleeding.  Consultation was done with Dr.  Pennie Rushing.  Chest x-ray was ordered and spiral CT.  Saline lock with  placed.  The patient was administered 20 mg of Lasix and pH labs and  ABGs were ordered and a urinalysis.  As mentioned all other blood work  came back fairly normal with the exception of a decreased pCO2 and  decreased O2 on the ABG.  Liver enzymes were normal.  Chest x-ray showed  pulmonary edema with pleural effusions and a CT scan showed pneumonia  versus pulmonary edema with more favored diagnosis being pneumonia.  Following the administration of the Lasix and placement of Foley  catheter Mary [name] proceeded to diurese very nicely and her O2 sat  came up to 95% on 3 liters O2 per nasal cannula and she began to feel  better and breathe more easily.  Dr. Su Hilt was consulted and decision  was made to admit with a diagnosis of shortness of breath with a plan to  began with daily doses of Zithromax 500 mg IV.  Vital signs q.4 hours.  Titration of oxygen to achieve O2 sats greater than 92%.  Regular diet  and up ad lib.  Chest x-ray in the morning with Motrin for pain control.      Eulogio Bear, CNM      ______________________________  Osborn Coho, M.D.    JM/MEDQ  D:  01/06/2008  T:  01/06/2008  Job:  [id number]

## 2010-06-04 NOTE — Op Note (Signed)
Mary [name]               ACCOUNT NO.:  [id number]   MEDICAL RECORD NO.:  [id number]          PATIENT TYPE:  INP   LOCATION:  9320                          FACILITY:  [facility]   PHYSICIAN:  Hal Morales, M.D.DATE OF BIRTH:  1970-10-08   DATE OF PROCEDURE:  12/31/2007  DATE OF DISCHARGE:                               OPERATIVE REPORT   PREOPERATIVE DIAGNOSIS:  Intrauterine pregnancy at term, bicornuate  uterus, preterm premature rupture of membranes, preterm labor, and  nonreassuring fetal heart rate tracing.   POSTOPERATIVE DIAGNOSES:  Intrauterine pregnancy at term, bicornuate  uterus, preterm premature rupture of membranes, preterm labor,  nonreassuring fetal heart rate tracing, and body cord.   PROCEDURE:  Primary low-transverse cesarean section.   SURGEON:  Hal Morales, MD   FIRST ASSISTANT:  French Ana, Certified Nurse Midwife.   ANESTHESIA:  Epidural.   ESTIMATED BLOOD LOSS:  500 mL.   COMPLICATIONS:  None.   FINDINGS:  The patient was delivered of a female infant whose name is  Mary [name] weighing 1874 grams or 4 pounds 2 ounces.  Apgars were 8 and 9 at  one and five minutes respectively.  The placenta contained an  eccentrically inserted 3-vessel cord.  The uterus was globular with  bilateral tubes and ovaries, but all consistent with a bicornuate  uterus.   PROCEDURE:  The patient was taken to the operating room after  appropriate identification and after a discussion with the parents  concerning the indications for the procedure, which had to do with  preterm premature rupture of membranes and preterm labor with repetitive  variable decelerations.  The risks of anesthesia, bleeding, infection,  and damage to adjacent organs were explained, and the patient had their  questions answered.  The patient was placed on the operating table with  her labor epidural and Foley catheter in place.  The labor epidural was  dosed for surgical anesthesia.  The  patient was placed in the supine  position with a left lateral tilt.  The abdomen was prepped multiple  layers of Betadine and draped as a sterile field.  After assurance of  adequate anesthesia, a transverse injection of 0.25% Marcaine for a  total of 20 mL was undertaken suprapubically.  A suprapubic incision was  made, and the abdomen opened in layers.  The peritoneum was entered, and  the bladder blade placed.  The uterus was incised approximately 2 cm  above the uterovesical fold and that incision taken laterally on either  side bluntly.  The infant was delivered from the occiput transverse  position and after having the nares and pharynx suctioned and the cord  unwrapped from around the body 3 times.  The cord was clamped and cut,  and the infant handed off to the awaiting pediatricians.  The placenta  was allowed to separate from the uterus and was removed from the  operative field.  The appropriate cord blood was drawn.  The uterine  incision was then closed with a running interlocking suture of 0 Vicryl.  An imbricating suture of 0 Vicryl was placed with adequate hemostasis.  Copious irrigation was carried out.  The abdominal peritoneum was closed  with running suture of 2-0 Vicryl.  The rectus fascia was closed with a  running suture of 0 Vicryl, then reinforced on either side of midline  with figure-of-eight sutures of 0 Vicryl.  The subcutaneous tissue was  made hemostatic with Bovie cautery and reapproximated with mattress  sutures.  The skin incision was repaired with a subcuticular suture of 3-  0 Monocryl.  Steri-Strips were applied and a sterile dressing applied.  The patient was taken from the operating room to the recovery room in  satisfactory condition having tolerated the procedure well with sponge  and instrument counts correct.  The infant went to the Neonatal  Intensive Care Unit.      Hal Morales, M.D.  Electronically Signed     VPH/MEDQ  D:   12/31/2007  T:  01/01/2008  Job:  [id number]

## 2010-06-04 NOTE — Discharge Summary (Signed)
Mary [name], Mary [name]               ACCOUNT NO.:  [id number]   MEDICAL RECORD NO.:  [id number]          PATIENT TYPE:  INP   LOCATION:  9149                          FACILITY:  [facility]   PHYSICIAN:  Osborn Coho, M.D.   DATE OF BIRTH:  [birth year]   DATE OF ADMISSION:  11/02/2007  DATE OF DISCHARGE:  11/03/2007                               DISCHARGE SUMMARY   DISCHARGING PHYSICIAN:  Erie Noe P. Haygood, MD   ADMISSION DIAGNOSES:  1. Intrauterine pregnancy at 24-5/7 weeks.  2. Preterm labor with cervical shortening.  3. Marginal cord insertion.   DISCHARGE DIAGNOSES:  1. Intrauterine pregnancy at 24-5/7 weeks.  2. Preterm labor with cervical shortening.  3. Marginal cord insertion.  4. Status post steroid series.   HOSPITAL PROCEDURES:  Betamethasone injections and electronic fetal  monitoring.   HOSPITAL COURSE:  The patient was admitted from the office for bloody  mucus discharge and uterine contractions.  Upon admission, her cervix  was closed, 40% effaced, and -2 station.  Speculum exam was normal  without bleeding seen.  Labs were all within normal limits.  Urinalysis  was negative.  Ultrasound showed size equals dates with cephalic  presentation, normal AFI, anterior placenta with marginal cord  insertion, and 68% growth.  Cervical length was 2.1 cm and closed.  She  was given a course of betamethasone and fetal fibronectin was done.  Prior to discharge, she had some contractions sporadically through the  night, which are now infrequent, and fetal heart rate remained stable  and reassuring.  She was deemed to receive full benefit of her hospital  stay and was discharged home.   DISCHARGE MEDICATIONS:  1. Terbutaline 2.5 mg p.o. q.4 h p.r.n. contractions.  2. Tylenol 1 to 2 p.o. q.4-6 hours p.r.n. headache.  3. Prenatal vitamin 1 p.o. daily.   DISCHARGE LABS:  White blood cell count 12.4, hemoglobin 13.1, and  platelets 288.  Other labs pending.  Urine culture  negative.   DISCHARGE INSTRUCTIONS:  Level-2 bed rest for 2 weeks and monitoring for  signs of preterm labor.   DISCHARGE FOLLOWUP:  To occur in 1-2 weeks at the office.  The patient  will schedule.   CONDITION ON DISCHARGE:  Good.      Marie L. Williams, C.N.M.      Osborn Coho, M.D.  Electronically Signed    MLW/MEDQ  D:  11/03/2007  T:  11/04/2007  Job:  [id number]

## 2010-06-25 ENCOUNTER — Other Ambulatory Visit: Payer: Self-pay | Admitting: *Deleted

## 2010-06-25 MED ORDER — LISDEXAMFETAMINE DIMESYLATE 70 MG PO CAPS: 70.0000 mg | ORAL_CAPSULE | ORAL | Status: DC

## 2010-07-31 ENCOUNTER — Other Ambulatory Visit: Payer: Self-pay | Admitting: *Deleted

## 2010-07-31 MED ORDER — LISDEXAMFETAMINE DIMESYLATE 70 MG PO CAPS: 70.0000 mg | ORAL_CAPSULE | ORAL | Status: DC

## 2010-07-31 NOTE — Telephone Encounter (Signed)
Pt aware.

## 2010-08-19 ENCOUNTER — Telehealth: Payer: Self-pay | Admitting: Family Medicine

## 2010-08-19 NOTE — Telephone Encounter (Signed)
Left message to call office

## 2010-08-19 NOTE — Telephone Encounter (Signed)
Pt return call stating that bite still has some redness but does seem to be healing. Pt denies any swelling, drainage, pain or fever. Pt notes that she feels fine otherwise. Pt states that she is unable to come in earlier then Wednesday due to son has appt. Pt advise of signs and symptoms to watch for and to go to ED/UC if any occur. Pt ok.

## 2010-08-20 ENCOUNTER — Encounter: Payer: Self-pay | Admitting: Internal Medicine

## 2010-08-21 ENCOUNTER — Encounter: Payer: Self-pay | Admitting: Internal Medicine

## 2010-08-21 ENCOUNTER — Ambulatory Visit (INDEPENDENT_AMBULATORY_CARE_PROVIDER_SITE_OTHER): Payer: Medicaid Other | Admitting: Internal Medicine

## 2010-08-21 VITALS — BP 128/82 | HR 73 | Temp 98.5°F | Wt 183.2 lb

## 2010-08-21 DIAGNOSIS — L906 Striae atrophicae: Secondary | ICD-10-CM

## 2010-08-21 DIAGNOSIS — L601 Onycholysis: Secondary | ICD-10-CM

## 2010-08-21 DIAGNOSIS — L608 Other nail disorders: Secondary | ICD-10-CM

## 2010-08-21 NOTE — Patient Instructions (Signed)
Keep the toenails dry as possible. Use a blow dryer 1-2 times a day of the nails. Discard any shoes which may be infested with a fungus.

## 2010-08-21 NOTE — Progress Notes (Signed)
  Subjective:    Patient ID: Mary [name], female    DOB: 1970-12-19, 40 y.o.   MRN: [id number]  HPI #1 10 days ago she was seen in emergency room in [facility] for a presumed brown recluse  spider bite of the left breast. At that time she had fever, chills, nausea and severe headache. This was treated with sulfa and cephalexin. She's concerned about residual changes in the skin and whether there is any residual infection. She denies fever, chills, or sweats.  #2  for several months she's noted changes in the great toenails; this is worse on the right and left. Initially she thought the changes related to trauma. She has not treated this other than  getting pedicures. She is not a diabetic.    Review of Systems     Objective:   Physical Exam She is healthy and well-nourished. She has no scleral icterus or jaundice.  She has no lymphadenopathy about the head, neck or axilla.  There is a striae type lesion of the left breast without erythema or tenderness.  Pedal pulses are intact.  She has mild fungal changes in the great toenails, greater on the right.          Assessment & Plan:  #1 postinfectious changes subcutaneously; no evidence of active infection  #2 fungal nail changes; no history of diabetes or immunosuppression. Plan: The residual breast lesion unfortunately it is unlikely to change as the active cellulitis, although resolved, has caused stretching of  the tissues  Options to treat the toenail fungus were  discussed.

## 2010-10-11 ENCOUNTER — Other Ambulatory Visit: Payer: Self-pay

## 2010-10-11 MED ORDER — LISDEXAMFETAMINE DIMESYLATE 70 MG PO CAPS: 70.0000 mg | ORAL_CAPSULE | ORAL | Status: DC

## 2010-10-11 NOTE — Telephone Encounter (Signed)
Vm left making patient Rx ready for pick up     KP

## 2010-10-21 LAB — URINALYSIS, ROUTINE W REFLEX MICROSCOPIC
Glucose, UA: NEGATIVE
Hgb urine dipstick: NEGATIVE
Protein, ur: NEGATIVE
pH: 6

## 2010-10-21 LAB — CBC
Hemoglobin: 13.1
MCHC: 33.8
MCV: 87.3
RDW: 14.3

## 2010-10-21 LAB — GC/CHLAMYDIA PROBE AMP, GENITAL
Chlamydia, DNA Probe: NEGATIVE
GC Probe Amp, Genital: NEGATIVE

## 2010-10-21 LAB — WET PREP, GENITAL
Clue Cells Wet Prep HPF POC: NONE SEEN
Clue Cells Wet Prep HPF POC: NONE SEEN
Trich, Wet Prep: NONE SEEN
Trich, Wet Prep: NONE SEEN
Yeast Wet Prep HPF POC: NONE SEEN
Yeast Wet Prep HPF POC: NONE SEEN

## 2010-10-21 LAB — DIFFERENTIAL
Basophils Absolute: 0
Basophils Relative: 0
Eosinophils Absolute: 0
Monocytes Absolute: 0.4
Neutro Abs: 10.6 — ABNORMAL HIGH
Neutrophils Relative %: 85 — ABNORMAL HIGH

## 2010-10-21 LAB — FETAL FIBRONECTIN: Fetal Fibronectin: POSITIVE

## 2010-10-21 LAB — STREP B DNA PROBE

## 2010-10-21 LAB — URINE CULTURE

## 2010-10-24 LAB — URINALYSIS, ROUTINE W REFLEX MICROSCOPIC
Bilirubin Urine: NEGATIVE
Ketones, ur: NEGATIVE mg/dL
Nitrite: NEGATIVE
Protein, ur: NEGATIVE mg/dL

## 2010-10-24 LAB — BLOOD GAS, ARTERIAL
Acid-Base Excess: 0.4 mmol/L (ref 0.0–2.0)
Drawn by: 270521
FIO2: 0.21 %
pCO2 arterial: 34.7 mmHg — ABNORMAL LOW (ref 35.0–45.0)

## 2010-10-24 LAB — CBC
HCT: 31.6 % — ABNORMAL LOW (ref 36.0–46.0)
HCT: 35.8 % — ABNORMAL LOW (ref 36.0–46.0)
Hemoglobin: 12.1 g/dL (ref 12.0–15.0)
Hemoglobin: 13.1 g/dL (ref 12.0–15.0)
Hemoglobin: 13.3 g/dL (ref 12.0–15.0)
MCHC: 33.6 g/dL (ref 30.0–36.0)
MCHC: 33.8 g/dL (ref 30.0–36.0)
MCHC: 34.6 g/dL (ref 30.0–36.0)
MCV: 87.8 fL (ref 78.0–100.0)
MCV: 87.8 fL (ref 78.0–100.0)
MCV: 88 fL (ref 78.0–100.0)
MCV: 88.1 fL (ref 78.0–100.0)
MCV: 88.5 fL (ref 78.0–100.0)
Platelets: 333 10*3/uL (ref 150–400)
Platelets: 336 10*3/uL (ref 150–400)
RBC: 3.57 MIL/uL — ABNORMAL LOW (ref 3.87–5.11)
RBC: 4.06 MIL/uL (ref 3.87–5.11)
RBC: 4.32 MIL/uL (ref 3.87–5.11)
RBC: 4.48 MIL/uL (ref 3.87–5.11)
RDW: 14.1 % (ref 11.5–15.5)
RDW: 14.3 % (ref 11.5–15.5)
RDW: 14.3 % (ref 11.5–15.5)
WBC: 10.3 10*3/uL (ref 4.0–10.5)
WBC: 12.1 10*3/uL — ABNORMAL HIGH (ref 4.0–10.5)
WBC: 13.6 10*3/uL — ABNORMAL HIGH (ref 4.0–10.5)

## 2010-10-24 LAB — DIFFERENTIAL
Basophils Absolute: 0 10*3/uL (ref 0.0–0.1)
Basophils Absolute: 0 10*3/uL (ref 0.0–0.1)
Basophils Absolute: 0 10*3/uL (ref 0.0–0.1)
Basophils Relative: 0 % (ref 0–1)
Basophils Relative: 0 % (ref 0–1)
Basophils Relative: 0 % (ref 0–1)
Eosinophils Absolute: 0.1 10*3/uL (ref 0.0–0.7)
Lymphocytes Relative: 23 % (ref 12–46)
Lymphocytes Relative: 23 % (ref 12–46)
Lymphs Abs: 2 10*3/uL (ref 0.7–4.0)
Monocytes Absolute: 0.5 10*3/uL (ref 0.1–1.0)
Monocytes Absolute: 0.6 10*3/uL (ref 0.1–1.0)
Monocytes Absolute: 0.7 10*3/uL (ref 0.1–1.0)
Monocytes Relative: 6 % (ref 3–12)
Monocytes Relative: 6 % (ref 3–12)
Monocytes Relative: 6 % (ref 3–12)
Neutro Abs: 5.3 10*3/uL (ref 1.7–7.7)
Neutro Abs: 5.5 10*3/uL (ref 1.7–7.7)
Neutro Abs: 8 10*3/uL — ABNORMAL HIGH (ref 1.7–7.7)
Neutro Abs: 9.3 10*3/uL — ABNORMAL HIGH (ref 1.7–7.7)
Neutrophils Relative %: 66 % (ref 43–77)
Neutrophils Relative %: 74 % (ref 43–77)
Neutrophils Relative %: 77 % (ref 43–77)

## 2010-10-24 LAB — COMPREHENSIVE METABOLIC PANEL
Albumin: 1.9 g/dL — ABNORMAL LOW (ref 3.5–5.2)
BUN: 8 mg/dL (ref 6–23)
Creatinine, Ser: 0.53 mg/dL (ref 0.4–1.2)
Total Bilirubin: 0.4 mg/dL (ref 0.3–1.2)
Total Protein: 4.9 g/dL — ABNORMAL LOW (ref 6.0–8.3)

## 2010-10-24 LAB — URIC ACID: Uric Acid, Serum: 4.3 mg/dL (ref 2.4–7.0)

## 2010-10-24 LAB — STREP B DNA PROBE: Strep Group B Ag: NEGATIVE

## 2010-11-21 ENCOUNTER — Ambulatory Visit (INDEPENDENT_AMBULATORY_CARE_PROVIDER_SITE_OTHER): Payer: Medicaid Other | Admitting: Family Medicine

## 2010-11-21 ENCOUNTER — Encounter: Payer: Self-pay | Admitting: Family Medicine

## 2010-11-21 VITALS — BP 104/70 | HR 81 | Temp 98.0°F | Wt 185.6 lb

## 2010-11-21 DIAGNOSIS — H6690 Otitis media, unspecified, unspecified ear: Secondary | ICD-10-CM

## 2010-11-21 DIAGNOSIS — J029 Acute pharyngitis, unspecified: Secondary | ICD-10-CM

## 2010-11-21 DIAGNOSIS — J329 Chronic sinusitis, unspecified: Secondary | ICD-10-CM

## 2010-11-21 MED ORDER — AMOXICILLIN-POT CLAVULANATE 400-57 MG/5ML PO SUSR: ORAL | Status: AC

## 2010-11-21 NOTE — Progress Notes (Signed)
  Subjective:     Mary [name] is a 40 y.o. female who presents for evaluation of sore throat. Associated symptoms include dry cough, nasal blockage, green nasal discharge, sinus and nasal congestion, bilateral sinus pain and sore throat. Onset of symptoms was 2 days ago, and have been gradually worsening since that time. She is drinking plenty of fluids. She has had a recent close exposure to someone with proven streptococcal pharyngitis.  The following portions of the patient's history were reviewed and updated as appropriate: allergies, current medications, past family history, past medical history, past social history, past surgical history and problem list.  Review of Systems Pertinent items are noted in HPI.    Objective:    BP 104/70  Pulse 81  Temp(Src) 98 F (36.7 C) (Oral)  Wt 185 lb 9.6 oz (84.188 kg)  SpO2 98% General appearance: alert, cooperative, appears stated age and no distress Ears: normal TM and external ear canal left ear and abnormal TM right ear - erythematous and bulging Nose: green discharge, moderate congestion, sinus tenderness bilateral Throat: lips, mucosa, and tongue normal; teeth and gums normal Neck: no adenopathy, supple, symmetrical, trachea midline and thyroid not enlarged, symmetric, no tenderness/mass/nodules Lungs: clear to auscultation bilaterally Heart: regular rate and rhythm, S1, S2 normal, no murmur, click, rub or gallop  Laboratory Strep test done. Results:negative.    Assessment:    Acute pharyngitis, likely  sinusitis and ROM.    Plan:    Patient placed on antibiotics. Use of OTC analgesics recommended as well as salt water gargles. Use of decongestant recommended. Follow up as needed.

## 2010-11-21 NOTE — Patient Instructions (Addendum)
Sinusitis Sinuses are air pockets within the bones of your face. The growth of bacteria within a sinus leads to infection. The infection prevents the sinuses from draining. This infection is called sinusitis. SYMPTOMS  There will be different areas of pain depending on which sinuses have become infected.  The maxillary sinuses often produce pain beneath the eyes.   Frontal sinusitis may cause pain in the middle of the forehead and above the eyes.  Other problems (symptoms) include:  Toothaches.   Colored, pus-like (purulent) drainage from the nose.   Swelling, warmth, and tenderness over the sinus areas may be signs of infection.  TREATMENT  Sinusitis is most often determined by an exam.X-rays may be taken. If x-rays have been taken, make sure you obtain your results or find out how you are to obtain them. Your caregiver may give you medications (antibiotics). These are medications that will help kill the bacteria causing the infection. You may also be given a medication (decongestant) that helps to reduce sinus swelling.  HOME CARE INSTRUCTIONS   Only take over-the-counter or prescription medicines for pain, discomfort, or fever as directed by your caregiver.   Drink extra fluids. Fluids help thin the mucus so your sinuses can drain more easily.   Applying either moist heat or ice packs to the sinus areas may help relieve discomfort.   Use saline nasal sprays to help moisten your sinuses. The sprays can be found at your local drugstore.  SEEK IMMEDIATE MEDICAL CARE IF:  You have a fever.   You have increasing pain, severe headaches, or toothache.   You have nausea, vomiting, or drowsiness.   You develop unusual swelling around the face or trouble seeing.  MAKE SURE YOU:   Understand these instructions.   Will watch your condition.   Will get help right away if you are not doing well or get worse.  Document Released: 01/06/2005 Document Revised: 09/18/2010 Document Reviewed:  08/05/2006 [facility] Patient Information 2012 [place], [place].  Otitis Media, Adult A middle ear infection is an infection in the space behind the eardrum. The medical name for this is "otitis media." It may happen after a common cold. It is caused by a germ that starts growing in that space. You may feel swollen glands in your neck on the side of the ear infection. HOME CARE INSTRUCTIONS   Take your medicine as directed until it is gone, even if you feel better after the first few days.   Only take over-the-counter or prescription medicines for pain, discomfort, or fever as directed by your caregiver.   Occasional use of a nasal decongestant a couple times per day may help with discomfort and help the eustachian tube to drain better.  Follow up with your caregiver in 10 to 14 days or as directed, to be certain that the infection has cleared. Not keeping the appointment could result in a chronic or permanent injury, pain, hearing loss and disability. If there is any problem keeping the appointment, you must call back to this facility for assistance. SEEK IMMEDIATE MEDICAL CARE IF:   You are not getting better in 2 to 3 days.   You have pain that is not controlled with medication.   You feel worse instead of better.   You cannot use the medication as directed.   You develop swelling, redness or pain around the ear or stiffness in your neck.  MAKE SURE YOU:   Understand these instructions.   Will watch your condition.   Will  get help right away if you are not doing well or get worse.  Document Released: 10/12/2003 Document Revised: 09/18/2010 Document Reviewed: 08/13/2007 [facility] Patient Information 2012 [place], [place].

## 2010-11-25 ENCOUNTER — Telehealth: Payer: Self-pay | Admitting: *Deleted

## 2010-11-25 MED ORDER — CEFUROXIME AXETIL 500 MG PO TABS: 500.0000 mg | ORAL_TABLET | Freq: Two times a day (BID) | ORAL | Status: AC

## 2010-11-25 NOTE — Telephone Encounter (Signed)
Pt seen on 11-21-10 now c/o chills, body ache and vomiting and some abdominal cramping. Pt notes that she has been able to eat some cracker and drink something today. Pt denies any fever. Pt indicated that she has not taking med today. Please advise

## 2010-11-25 NOTE — Telephone Encounter (Signed)
Ov if no improvement

## 2010-11-25 NOTE — Telephone Encounter (Signed)
Pt indicated that she has had some diarrhea. Pt aware to D/C med and new Rx sent to pharmacy and OVINB

## 2010-11-25 NOTE — Telephone Encounter (Signed)
abd cramps could be from abx.  Any diarrhea?   Change abx to ceftin 500 mg bid for 10 days-----if no better needs  ovl

## 2011-01-23 ENCOUNTER — Telehealth: Payer: Self-pay | Admitting: Family Medicine

## 2011-01-23 NOTE — Telephone Encounter (Signed)
Refill x1--- make sure pt aware that she needs to be seen every 6 months for ADD

## 2011-01-23 NOTE — Telephone Encounter (Signed)
rx for vyvance - patient will pick rx up Monday [id number]

## 2011-01-23 NOTE — Telephone Encounter (Signed)
Refill request for Vyvanse, patient was last seen 11/21/10 for a sore throat and Rx filled 10/11/10. She has an apt scheduled for 02/26/11. Please advise     KP

## 2011-01-24 MED ORDER — LISDEXAMFETAMINE DIMESYLATE 70 MG PO CAPS: 70.0000 mg | ORAL_CAPSULE | ORAL | Status: DC

## 2011-01-24 NOTE — Telephone Encounter (Signed)
Printed/signed rx up front for pt pick up, spoke to pt to advise that she needs to make sure she has an appt set up every 6 months for ADD, pt understood and stated that she is aware and was unable to get an appt this time till feb, pt will pick up rx from front desk on monday

## 2011-01-27 ENCOUNTER — Ambulatory Visit (INDEPENDENT_AMBULATORY_CARE_PROVIDER_SITE_OTHER): Payer: Medicaid Other

## 2011-01-27 DIAGNOSIS — Z23 Encounter for immunization: Secondary | ICD-10-CM

## 2011-02-26 ENCOUNTER — Encounter: Payer: Medicaid Other | Admitting: Family Medicine

## 2011-03-12 ENCOUNTER — Telehealth: Payer: Self-pay | Admitting: Family Medicine

## 2011-03-12 MED ORDER — LISDEXAMFETAMINE DIMESYLATE 70 MG PO CAPS: 70.0000 mg | ORAL_CAPSULE | ORAL | Status: DC

## 2011-03-12 NOTE — Telephone Encounter (Signed)
Left Pt detail message, Rx ready for pick up. 

## 2011-03-12 NOTE — Telephone Encounter (Signed)
Patient is requesting her Rx for Vyvanse. Call when ready to pick up.

## 2011-03-18 ENCOUNTER — Other Ambulatory Visit: Payer: Self-pay | Admitting: Family Medicine

## 2011-03-19 ENCOUNTER — Other Ambulatory Visit: Payer: Self-pay | Admitting: Family Medicine

## 2011-03-19 MED ORDER — BUPROPION HCL ER (XL) 300 MG PO TB24: 300.0000 mg | ORAL_TABLET | ORAL | Status: DC

## 2011-03-19 NOTE — Telephone Encounter (Signed)
Faxed.   KP 

## 2011-03-25 ENCOUNTER — Ambulatory Visit (INDEPENDENT_AMBULATORY_CARE_PROVIDER_SITE_OTHER): Payer: Medicaid Other | Admitting: Family

## 2011-03-25 ENCOUNTER — Encounter: Payer: Self-pay | Admitting: Family

## 2011-03-25 DIAGNOSIS — J029 Acute pharyngitis, unspecified: Secondary | ICD-10-CM

## 2011-03-25 DIAGNOSIS — H6593 Unspecified nonsuppurative otitis media, bilateral: Secondary | ICD-10-CM | POA: Insufficient documentation

## 2011-03-25 DIAGNOSIS — H6590 Unspecified nonsuppurative otitis media, unspecified ear: Secondary | ICD-10-CM

## 2011-03-25 DIAGNOSIS — J020 Streptococcal pharyngitis: Secondary | ICD-10-CM | POA: Insufficient documentation

## 2011-03-25 LAB — POCT RAPID STREP A (OFFICE): Rapid Strep A Screen: POSITIVE — AB

## 2011-03-25 MED ORDER — ANTIPYRINE-BENZOCAINE 5.4-1.4 % OT SOLN: 3.0000 [drp] | OTIC | Status: AC | PRN

## 2011-03-25 MED ORDER — AMOXICILLIN-POT CLAVULANATE 875-125 MG PO TABS: 1.0000 | ORAL_TABLET | Freq: Two times a day (BID) | ORAL | Status: AC

## 2011-03-25 NOTE — Progress Notes (Signed)
Addended by: Mervin Kung A on: 03/25/2011 03:48 PM   Modules accepted: Orders

## 2011-03-25 NOTE — Assessment & Plan Note (Signed)
Rapid strep is +. Will plan to treat with Augmentin.

## 2011-03-25 NOTE — Patient Instructions (Signed)
Please call if symptoms worsen, or if no improvement in 2-3 days.  You may use motrin 600mg  every 6 hours as needed for pain.

## 2011-03-25 NOTE — Progress Notes (Signed)
  Subjective:    Patient ID: Mary [name], female    DOB: [birth year], 41 y.o.   MRN: [id number]  HPI  Mary [name] is a 41 yr old female with chief complaint of left ear pain. Pain started last night a 11 PM.  Reports ear pain is severe.  ? Mild drainage.  Denies associated fever.  Does report associated green nasal congestion.  Her son has been ill.    Review of Systems See HPI  Past Medical History  Diagnosis Date  . Depression   . Narcolepsy without cataplexy     sleep studies 1999: AHI 4/hr, MSLT 4/2. 08/16/09 AHI 5.7/RDI 12.6  . Rhinitis   . Asthma     History   Social History  . Marital Status: Married    Spouse Name: N/A    Number of Children: N/A  . Years of Education: N/A   Occupational History  . Not on file.   Social History Main Topics  . Smoking status: Never Smoker   . Smokeless tobacco: Not on file  . Alcohol Use: No  . Drug Use: No  . Sexually Active: Not on file   Other Topics Concern  . Not on file   Social History Narrative  . No narrative on file    Past Surgical History  Procedure Date  . Tonsillectomy   . Uppp for osa 1999    Family History  Problem Relation Age of Onset  . Breast cancer    . Depression      No Known Allergies  Current Outpatient Prescriptions on File Prior to Visit  Medication Sig Dispense Refill  . albuterol (PROVENTIL HFA) 108 (90 BASE) MCG/ACT inhaler Inhale 2 puffs into the lungs 2 (two) times daily as needed.        . ALPRAZolam (XANAX) 1 MG tablet Take 1 mg by mouth 3 (three) times daily as needed.        Marland Kitchen buPROPion (WELLBUTRIN XL) 300 MG 24 hr tablet Take 1 tablet (300 mg total) by mouth every morning.  30 tablet  2  . lisdexamfetamine (VYVANSE) 70 MG capsule Take 1 capsule (70 mg total) by mouth every morning.  30 capsule  0    BP 114/70  Pulse 79  Temp(Src) 97.9 F (36.6 C) (Oral)  Resp 16  Wt 191 lb 1.3 oz (86.673 kg)  SpO2 97%       Objective:   Physical Exam  Constitutional: She appears  well-developed and well-nourished. No distress.  HENT:  Head: Normocephalic and atraumatic.       R TM, pink in color, no bulging.  L TM- red erythema, significant bulging, canal also with some mild inflammation.   Eyes: No scleral icterus.  Cardiovascular: Normal rate and regular rhythm.   No murmur heard. Pulmonary/Chest: Effort normal and breath sounds normal.          Assessment & Plan:

## 2011-03-25 NOTE — Assessment & Plan Note (Signed)
L>R.  Will treat with augmetin.  A/B otic drops and motrin as needed for pain.  Pt is to contact us if symptoms worsen or if no improvement in the next 2-3 days.

## 2011-03-26 ENCOUNTER — Telehealth: Payer: Self-pay | Admitting: *Deleted

## 2011-03-26 NOTE — Telephone Encounter (Signed)
Received message from pt stating her son was prescribed an antibiotic and wanted to know if it would cover strep too. Attempted to reach pt and left detailed message that she could check with the prescribing doctor or please call with name of abx that was prescribed.

## 2011-03-27 ENCOUNTER — Telehealth: Payer: Self-pay | Admitting: *Deleted

## 2011-03-27 MED ORDER — TRAMADOL HCL 50 MG PO TABS: 50.0000 mg | ORAL_TABLET | Freq: Three times a day (TID) | ORAL | Status: AC | PRN

## 2011-03-27 NOTE — Telephone Encounter (Signed)
Received call from pt stating her throat is a little better but her ear pain is still bad. Doesn't feel that is worse but does not feel any better. States she still cannot hear out of the left ear and it was draining a blood tinged/yellowish fluid all day yesterday. Reports that motrin and ear drops are not relieving the pain.  Please advise.

## 2011-03-27 NOTE — Telephone Encounter (Signed)
Left detailed message on pt's home# and to call if any questions. 

## 2011-03-27 NOTE — Telephone Encounter (Signed)
Rx sent for tramadol- advise pt that this may cause drowsiness.  If no improvement by tomorrow, she should be re-evaluated.

## 2011-03-28 ENCOUNTER — Telehealth: Payer: Self-pay | Admitting: *Deleted

## 2011-03-28 NOTE — Telephone Encounter (Signed)
Notified pt and she voices understanding. 

## 2011-03-28 NOTE — Telephone Encounter (Signed)
It can take several weeks for the fluid to resolve in her ear.  If hearing is not improved in 2 weeks let us know.

## 2011-03-28 NOTE — Telephone Encounter (Signed)
Pt left message stating she still cannot hear out of her ear and wants to know if this is normal? Please advise.

## 2011-03-31 ENCOUNTER — Encounter: Payer: Self-pay | Admitting: Family

## 2011-03-31 ENCOUNTER — Ambulatory Visit (INDEPENDENT_AMBULATORY_CARE_PROVIDER_SITE_OTHER): Payer: Medicaid Other | Admitting: Family

## 2011-03-31 VITALS — BP 100/74 | HR 70 | Temp 98.0°F | Resp 16 | Wt 191.0 lb

## 2011-03-31 DIAGNOSIS — H6593 Unspecified nonsuppurative otitis media, bilateral: Secondary | ICD-10-CM

## 2011-03-31 DIAGNOSIS — H6690 Otitis media, unspecified, unspecified ear: Secondary | ICD-10-CM

## 2011-03-31 DIAGNOSIS — H6590 Unspecified nonsuppurative otitis media, unspecified ear: Secondary | ICD-10-CM

## 2011-03-31 MED ORDER — CEFDINIR 300 MG PO CAPS: 300.0000 mg | ORAL_CAPSULE | Freq: Two times a day (BID) | ORAL | Status: AC

## 2011-03-31 MED ORDER — HYDROCODONE-ACETAMINOPHEN 5-500 MG PO TABS: 1.0000 | ORAL_TABLET | Freq: Three times a day (TID) | ORAL | Status: AC | PRN

## 2011-03-31 MED ORDER — CEFTRIAXONE SODIUM 500 MG IJ SOLR
500.0000 mg | Freq: Once | INTRAMUSCULAR | Status: AC
  Administered 2011-03-31: 500 mg via INTRAMUSCULAR

## 2011-03-31 NOTE — Progress Notes (Signed)
Addended by: Mervin Kung A on: 03/31/2011 11:39 AM   Modules accepted: Orders

## 2011-03-31 NOTE — Patient Instructions (Signed)
You will be contacted about your referral to ENT. Let us know if you develop fever over 101.

## 2011-03-31 NOTE — Progress Notes (Signed)
Subjective:    Patient ID: Mary [name], female    DOB: Dec 31, 1970, 41 y.o.   MRN: [id number]  HPI  [name] is  41 yr old female seen last week for OM who presents today due to lack of improvement in her symptoms.  She denies fever. Reports that she continues augmentin without improvement.  L ear pain > R ear pain.  R ear was draining bloody drainage this AM.  Denies sinus drainage. Trouble hearing out of both ears L>R.  She has hx of tonsillectomy/adenoidectomy. Having trouble sleeping due to pain, tramadol and AB/Otic drops not helping.   Review of Systems    see HPI  Past Medical History  Diagnosis Date  . Depression   . Narcolepsy without cataplexy     sleep studies 1999: AHI 4/hr, MSLT 4/2. 08/16/09 AHI 5.7/RDI 12.6  . Rhinitis   . Asthma     History   Social History  . Marital Status: Married    Spouse Name: N/A    Number of Children: N/A  . Years of Education: N/A   Occupational History  . Not on file.   Social History Main Topics  . Smoking status: Never Smoker   . Smokeless tobacco: Not on file  . Alcohol Use: No  . Drug Use: No  . Sexually Active: Not on file   Other Topics Concern  . Not on file   Social History Narrative  . No narrative on file    Past Surgical History  Procedure Date  . Tonsillectomy   . Uppp for osa 1999    Family History  Problem Relation Age of Onset  . Breast cancer    . Depression      No Known Allergies  Current Outpatient Prescriptions on File Prior to Visit  Medication Sig Dispense Refill  . albuterol (PROVENTIL HFA) 108 (90 BASE) MCG/ACT inhaler Inhale 2 puffs into the lungs 2 (two) times daily as needed.        . ALPRAZolam (XANAX) 1 MG tablet Take 1 mg by mouth 3 (three) times daily as needed.        Marland Kitchen amoxicillin-clavulanate (AUGMENTIN) 875-125 MG per tablet Take 1 tablet by mouth every 12 (twelve) hours.  20 tablet  0  . antipyrine-benzocaine (AURALGAN) otic solution Place 3 drops into the left ear  every 2 (two) hours as needed for pain.  10 mL  0  . buPROPion (WELLBUTRIN XL) 300 MG 24 hr tablet Take 1 tablet (300 mg total) by mouth every morning.  30 tablet  2  . lisdexamfetamine (VYVANSE) 70 MG capsule Take 1 capsule (70 mg total) by mouth every morning.  30 capsule  0  . traMADol (ULTRAM) 50 MG tablet Take 1 tablet (50 mg total) by mouth every 8 (eight) hours as needed for pain.  15 tablet  0    BP 100/74  Pulse 70  Temp(Src) 98 F (36.7 C) (Oral)  Resp 16  Wt 191 lb 0.6 oz (86.655 kg)  SpO2 99%    Objective:   Physical Exam  Constitutional: She appears well-developed and well-nourished. No distress.  HENT:  Head: Normocephalic and atraumatic.  Mouth/Throat: No oropharyngeal exudate, posterior oropharyngeal edema or posterior oropharyngeal erythema.       R TM intact with serous effusion, no erythema or bulging. Dried blood in R ear canal-? Scab.  L TM intact clear effusion noted, dull- unable to visualize bony landmarks. No erythema. Improved since last visit. Canal without erythema.  Cardiovascular: Normal rate and regular rhythm.   No murmur heard. Pulmonary/Chest: Effort normal and breath sounds normal. No respiratory distress. She has no wheezes. She has no rales. She exhibits no tenderness.          Assessment & Plan:

## 2011-03-31 NOTE — Assessment & Plan Note (Signed)
Pain is not yet improving, but exam looks better.  IM Rocephin given in office. Change Augmentin to omnicef, arrange consult with ENT.  Vicodin for HS only- pt aware not to mix with tramadol or take if driving. She verbalizes understanding.

## 2011-04-04 ENCOUNTER — Telehealth: Payer: Self-pay | Admitting: *Deleted

## 2011-04-04 DIAGNOSIS — H6690 Otitis media, unspecified, unspecified ear: Secondary | ICD-10-CM

## 2011-04-04 HISTORY — PX: MYRINGOTOMY: SUR874

## 2011-04-04 NOTE — Telephone Encounter (Signed)
Left message on machine to return my call. 

## 2011-04-04 NOTE — Telephone Encounter (Signed)
Pt.notified

## 2011-04-04 NOTE — Telephone Encounter (Signed)
I have placed referral to Dr. Ezzard Standing.

## 2011-04-04 NOTE — Telephone Encounter (Signed)
Pt states she was not happy with her evaluation by the ENT that we referred her to.  Was told she would need a stronger antibiotic but was not given an Rx.  Felt there was lack of communication at her recent visit. Wants to be referred to Dr Ramond Dial [phone], he removed her addenoids and tonsils a few years ago. Please advise.

## 2011-04-16 ENCOUNTER — Ambulatory Visit (INDEPENDENT_AMBULATORY_CARE_PROVIDER_SITE_OTHER): Payer: Medicaid Other | Admitting: Family Medicine

## 2011-04-16 ENCOUNTER — Encounter: Payer: Self-pay | Admitting: Family Medicine

## 2011-04-16 VITALS — BP 116/70 | HR 81 | Temp 98.3°F | Ht 68.5 in | Wt 196.4 lb

## 2011-04-16 DIAGNOSIS — F419 Anxiety disorder, unspecified: Secondary | ICD-10-CM

## 2011-04-16 DIAGNOSIS — F329 Major depressive disorder, single episode, unspecified: Secondary | ICD-10-CM

## 2011-04-16 DIAGNOSIS — F341 Dysthymic disorder: Secondary | ICD-10-CM

## 2011-04-16 DIAGNOSIS — Z800 Family history of malignant neoplasm of digestive organs: Secondary | ICD-10-CM

## 2011-04-16 DIAGNOSIS — Z0000 Encounter for general adult medical examination without abnormal findings: Secondary | ICD-10-CM

## 2011-04-16 MED ORDER — DESVENLAFAXINE SUCCINATE ER 50 MG PO TB24: 50.0000 mg | ORAL_TABLET | Freq: Every day | ORAL | Status: DC

## 2011-04-16 NOTE — Patient Instructions (Signed)
Preventive Care for Adults, Female A healthy lifestyle and preventive care can promote health and wellness. Preventive health guidelines for women include the following key practices.  A routine yearly physical is a good way to check with your caregiver about your health and preventive screening. It is a chance to share any concerns and updates on your health, and to receive a thorough exam.   Visit your dentist for a routine exam and preventive care every 6 months. Brush your teeth twice a day and floss once a day. Good oral hygiene prevents tooth decay and gum disease.   The frequency of eye exams is based on your age, health, family medical history, use of contact lenses, and other factors. Follow your caregiver's recommendations for frequency of eye exams.   Eat a healthy diet. Foods like vegetables, fruits, whole grains, low-fat dairy products, and lean protein foods contain the nutrients you need without too many calories. Decrease your intake of foods high in solid fats, added sugars, and salt. Eat the right amount of calories for you.Get information about a proper diet from your caregiver, if necessary.   Regular physical exercise is one of the most important things you can do for your health. Most adults should get at least 150 minutes of moderate-intensity exercise (any activity that increases your heart rate and causes you to sweat) each week. In addition, most adults need muscle-strengthening exercises on 2 or more days a week.   Maintain a healthy weight. The body mass index (BMI) is a screening tool to identify possible weight problems. It provides an estimate of body fat based on height and weight. Your caregiver can help determine your BMI, and can help you achieve or maintain a healthy weight.For adults 20 years and older:   A BMI below 18.5 is considered underweight.   A BMI of 18.5 to 24.9 is normal.   A BMI of 25 to 29.9 is considered overweight.   A BMI of 30 and above is  considered obese.   Maintain normal blood lipids and cholesterol levels by exercising and minimizing your intake of saturated fat. Eat a balanced diet with plenty of fruit and vegetables. Blood tests for lipids and cholesterol should begin at age 20 and be repeated every 5 years. If your lipid or cholesterol levels are high, you are over 50, or you are at high risk for heart disease, you may need your cholesterol levels checked more frequently.Ongoing high lipid and cholesterol levels should be treated with medicines if diet and exercise are not effective.   If you smoke, find out from your caregiver how to quit. If you do not use tobacco, do not start.   If you are pregnant, do not drink alcohol. If you are breastfeeding, be very cautious about drinking alcohol. If you are not pregnant and choose to drink alcohol, do not exceed 1 drink per day. One drink is considered to be 12 ounces (355 mL) of beer, 5 ounces (148 mL) of wine, or 1.5 ounces (44 mL) of liquor.   Avoid use of street drugs. Do not share needles with anyone. Ask for help if you need support or instructions about stopping the use of drugs.   High blood pressure causes heart disease and increases the risk of stroke. Your blood pressure should be checked at least every 1 to 2 years. Ongoing high blood pressure should be treated with medicines if weight loss and exercise are not effective.   If you are 55 to 40   years old, ask your caregiver if you should take aspirin to prevent strokes.   Diabetes screening involves taking a blood sample to check your fasting blood sugar level. This should be done once every 3 years, after age 45, if you are within normal weight and without risk factors for diabetes. Testing should be considered at a younger age or be carried out more frequently if you are overweight and have at least 1 risk factor for diabetes.   Breast cancer screening is essential preventive care for women. You should practice "breast  self-awareness." This means understanding the normal appearance and feel of your breasts and may include breast self-examination. Any changes detected, no matter how small, should be reported to a caregiver. Women in their 20s and 30s should have a clinical breast exam (CBE) by a caregiver as part of a regular health exam every 1 to 3 years. After age 40, women should have a CBE every year. Starting at age 40, women should consider having a mammography (breast X-ray test) every year. Women who have a family history of breast cancer should talk to their caregiver about genetic screening. Women at a high risk of breast cancer should talk to their caregivers about having magnetic resonance imaging (MRI) and a mammography every year.   The Pap test is a screening test for cervical cancer. A Pap test can show cell changes on the cervix that might become cervical cancer if left untreated. A Pap test is a procedure in which cells are obtained and examined from the lower end of the uterus (cervix).   Women should have a Pap test starting at age 21.   Between ages 21 and 29, Pap tests should be repeated every 2 years.   Beginning at age 30, you should have a Pap test every 3 years as long as the past 3 Pap tests have been normal.   Some women have medical problems that increase the chance of getting cervical cancer. Talk to your caregiver about these problems. It is especially important to talk to your caregiver if a new problem develops soon after your last Pap test. In these cases, your caregiver may recommend more frequent screening and Pap tests.   The above recommendations are the same for women who have or have not gotten the vaccine for human papillomavirus (HPV).   If you had a hysterectomy for a problem that was not cancer or a condition that could lead to cancer, then you no longer need Pap tests. Even if you no longer need a Pap test, a regular exam is a good idea to make sure no other problems are  starting.   If you are between ages 65 and 70, and you have had normal Pap tests going back 10 years, you no longer need Pap tests. Even if you no longer need a Pap test, a regular exam is a good idea to make sure no other problems are starting.   If you have had past treatment for cervical cancer or a condition that could lead to cancer, you need Pap tests and screening for cancer for at least 20 years after your treatment.   If Pap tests have been discontinued, risk factors (such as a new sexual partner) need to be reassessed to determine if screening should be resumed.   The HPV test is an additional test that may be used for cervical cancer screening. The HPV test looks for the virus that can cause the cell changes on the cervix.   The cells collected during the Pap test can be tested for HPV. The HPV test could be used to screen women aged 30 years and older, and should be used in women of any age who have unclear Pap test results. After the age of 30, women should have HPV testing at the same frequency as a Pap test.   Colorectal cancer can be detected and often prevented. Most routine colorectal cancer screening begins at the age of 50 and continues through age 75. However, your caregiver may recommend screening at an earlier age if you have risk factors for colon cancer. On a yearly basis, your caregiver may provide home test kits to check for hidden blood in the stool. Use of a small camera at the end of a tube, to directly examine the colon (sigmoidoscopy or colonoscopy), can detect the earliest forms of colorectal cancer. Talk to your caregiver about this at age 50, when routine screening begins. Direct examination of the colon should be repeated every 5 to 10 years through age 75, unless early forms of pre-cancerous polyps or small growths are found.   Hepatitis C blood testing is recommended for all people born from 1945 through 1965 and any individual with known risks for hepatitis C.    Practice safe sex. Use condoms and avoid high-risk sexual practices to reduce the spread of sexually transmitted infections (STIs). STIs include gonorrhea, chlamydia, syphilis, trichomonas, herpes, HPV, and human immunodeficiency virus (HIV). Herpes, HIV, and HPV are viral illnesses that have no cure. They can result in disability, cancer, and death. Sexually active women aged 25 and younger should be checked for chlamydia. Older women with new or multiple partners should also be tested for chlamydia. Testing for other STIs is recommended if you are sexually active and at increased risk.   Osteoporosis is a disease in which the bones lose minerals and strength with aging. This can result in serious bone fractures. The risk of osteoporosis can be identified using a bone density scan. Women ages 65 and over and women at risk for fractures or osteoporosis should discuss screening with their caregivers. Ask your caregiver whether you should take a calcium supplement or vitamin D to reduce the rate of osteoporosis.   Menopause can be associated with physical symptoms and risks. Hormone replacement therapy is available to decrease symptoms and risks. You should talk to your caregiver about whether hormone replacement therapy is right for you.   Use sunscreen with sun protection factor (SPF) of 30 or more. Apply sunscreen liberally and repeatedly throughout the day. You should seek shade when your shadow is shorter than you. Protect yourself by wearing long sleeves, pants, a wide-brimmed hat, and sunglasses year round, whenever you are outdoors.   Once a month, do a whole body skin exam, using a mirror to look at the skin on your back. Notify your caregiver of new moles, moles that have irregular borders, moles that are larger than a pencil eraser, or moles that have changed in shape or color.   Stay current with required immunizations.   Influenza. You need a dose every fall (or winter). The composition of  the flu vaccine changes each year, so being vaccinated once is not enough.   Pneumococcal polysaccharide. You need 1 to 2 doses if you smoke cigarettes or if you have certain chronic medical conditions. You need 1 dose at age 65 (or older) if you have never been vaccinated.   Tetanus, diphtheria, pertussis (Tdap, Td). Get 1 dose of   Tdap vaccine if you are younger than age 65, are over 65 and have contact with an infant, are a healthcare worker, are pregnant, or simply want to be protected from whooping cough. After that, you need a Td booster dose every 10 years. Consult your caregiver if you have not had at least 3 tetanus and diphtheria-containing shots sometime in your life or have a deep or dirty wound.   HPV. You need this vaccine if you are a woman age 26 or younger. The vaccine is given in 3 doses over 6 months.   Measles, mumps, rubella (MMR). You need at least 1 dose of MMR if you were born in 1957 or later. You may also need a second dose.   Meningococcal. If you are age 19 to 21 and a first-year college student living in a residence hall, or have one of several medical conditions, you need to get vaccinated against meningococcal disease. You may also need additional booster doses.   Zoster (shingles). If you are age 60 or older, you should get this vaccine.   Varicella (chickenpox). If you have never had chickenpox or you were vaccinated but received only 1 dose, talk to your caregiver to find out if you need this vaccine.   Hepatitis A. You need this vaccine if you have a specific risk factor for hepatitis A virus infection or you simply wish to be protected from this disease. The vaccine is usually given as 2 doses, 6 to 18 months apart.   Hepatitis B. You need this vaccine if you have a specific risk factor for hepatitis B virus infection or you simply wish to be protected from this disease. The vaccine is given in 3 doses, usually over 6 months.  Preventive Services /  Frequency Ages 19 to 39  Blood pressure check.** / Every 1 to 2 years.   Lipid and cholesterol check.** / Every 5 years beginning at age 20.   Clinical breast exam.** / Every 3 years for women in their 20s and 30s.   Pap test.** / Every 2 years from ages 21 through 29. Every 3 years starting at age 30 through age 65 or 70 with a history of 3 consecutive normal Pap tests.   HPV screening.** / Every 3 years from ages 30 through ages 65 to 70 with a history of 3 consecutive normal Pap tests.   Hepatitis C blood test.** / For any individual with known risks for hepatitis C.   Skin self-exam. / Monthly.   Influenza immunization.** / Every year.   Pneumococcal polysaccharide immunization.** / 1 to 2 doses if you smoke cigarettes or if you have certain chronic medical conditions.   Tetanus, diphtheria, pertussis (Tdap, Td) immunization. / A one-time dose of Tdap vaccine. After that, you need a Td booster dose every 10 years.   HPV immunization. / 3 doses over 6 months, if you are 26 and younger.   Measles, mumps, rubella (MMR) immunization. / You need at least 1 dose of MMR if you were born in 1957 or later. You may also need a second dose.   Meningococcal immunization. / 1 dose if you are age 19 to 21 and a first-year college student living in a residence hall, or have one of several medical conditions, you need to get vaccinated against meningococcal disease. You may also need additional booster doses.   Varicella immunization.** / Consult your caregiver.   Hepatitis A immunization.** / Consult your caregiver. 2 doses, 6 to 18 months   apart.   Hepatitis B immunization.** / Consult your caregiver. 3 doses usually over 6 months.  Ages 40 to 64  Blood pressure check.** / Every 1 to 2 years.   Lipid and cholesterol check.** / Every 5 years beginning at age 20.   Clinical breast exam.** / Every year after age 40.   Mammogram.** / Every year beginning at age 40 and continuing for as  long as you are in good health. Consult with your caregiver.   Pap test.** / Every 3 years starting at age 30 through age 65 or 70 with a history of 3 consecutive normal Pap tests.   HPV screening.** / Every 3 years from ages 30 through ages 65 to 70 with a history of 3 consecutive normal Pap tests.   Fecal occult blood test (FOBT) of stool. / Every year beginning at age 50 and continuing until age 75. You may not need to do this test if you get a colonoscopy every 10 years.   Flexible sigmoidoscopy or colonoscopy.** / Every 5 years for a flexible sigmoidoscopy or every 10 years for a colonoscopy beginning at age 50 and continuing until age 75.   Hepatitis C blood test.** / For all people born from 1945 through 1965 and any individual with known risks for hepatitis C.   Skin self-exam. / Monthly.   Influenza immunization.** / Every year.   Pneumococcal polysaccharide immunization.** / 1 to 2 doses if you smoke cigarettes or if you have certain chronic medical conditions.   Tetanus, diphtheria, pertussis (Tdap, Td) immunization.** / A one-time dose of Tdap vaccine. After that, you need a Td booster dose every 10 years.   Measles, mumps, rubella (MMR) immunization. / You need at least 1 dose of MMR if you were born in 1957 or later. You may also need a second dose.   Varicella immunization.** / Consult your caregiver.   Meningococcal immunization.** / Consult your caregiver.   Hepatitis A immunization.** / Consult your caregiver. 2 doses, 6 to 18 months apart.   Hepatitis B immunization.** / Consult your caregiver. 3 doses, usually over 6 months.  Ages 65 and over  Blood pressure check.** / Every 1 to 2 years.   Lipid and cholesterol check.** / Every 5 years beginning at age 20.   Clinical breast exam.** / Every year after age 40.   Mammogram.** / Every year beginning at age 40 and continuing for as long as you are in good health. Consult with your caregiver.   Pap test.** /  Every 3 years starting at age 30 through age 65 or 70 with a 3 consecutive normal Pap tests. Testing can be stopped between 65 and 70 with 3 consecutive normal Pap tests and no abnormal Pap or HPV tests in the past 10 years.   HPV screening.** / Every 3 years from ages 30 through ages 65 or 70 with a history of 3 consecutive normal Pap tests. Testing can be stopped between 65 and 70 with 3 consecutive normal Pap tests and no abnormal Pap or HPV tests in the past 10 years.   Fecal occult blood test (FOBT) of stool. / Every year beginning at age 50 and continuing until age 75. You may not need to do this test if you get a colonoscopy every 10 years.   Flexible sigmoidoscopy or colonoscopy.** / Every 5 years for a flexible sigmoidoscopy or every 10 years for a colonoscopy beginning at age 50 and continuing until age 75.   Hepatitis   C blood test.** / For all people born from 1945 through 1965 and any individual with known risks for hepatitis C.   Osteoporosis screening.** / A one-time screening for women ages 65 and over and women at risk for fractures or osteoporosis.   Skin self-exam. / Monthly.   Influenza immunization.** / Every year.   Pneumococcal polysaccharide immunization.** / 1 dose at age 65 (or older) if you have never been vaccinated.   Tetanus, diphtheria, pertussis (Tdap, Td) immunization. / A one-time dose of Tdap vaccine if you are over 65 and have contact with an infant, are a healthcare worker, or simply want to be protected from whooping cough. After that, you need a Td booster dose every 10 years.   Varicella immunization.** / Consult your caregiver.   Meningococcal immunization.** / Consult your caregiver.   Hepatitis A immunization.** / Consult your caregiver. 2 doses, 6 to 18 months apart.   Hepatitis B immunization.** / Check with your caregiver. 3 doses, usually over 6 months.  ** Family history and personal history of risk and conditions may change your caregiver's  recommendations. Document Released: 03/04/2001 Document Revised: 12/26/2010 Document Reviewed: 06/03/2010 [employer] Patient Information 2012 [employer]. 

## 2011-04-16 NOTE — Progress Notes (Signed)
  Subjective:     Mary [name] is a 41 y.o. female and is here for a comprehensive physical exam. The patient reports no problems.  History   Social History  . Marital Status: Married    Spouse Name: N/A    Number of Children: N/A  . Years of Education: N/A   Occupational History  . Not on file.   Social History Main Topics  . Smoking status: Never Smoker   . Smokeless tobacco: Not on file  . Alcohol Use: No  . Drug Use: No  . Sexually Active: Not on file   Other Topics Concern  . Not on file   Social History Narrative  . No narrative on file   Health Maintenance  Topic Date Due  . Pap Smear  09/19/1989  . Tetanus/tdap  09/20/1990  . Influenza Vaccine  10/21/2011    The following portions of the patient's history were reviewed and updated as appropriate: allergies, current medications, past family history, past medical history, past social history, past surgical history and problem list.  Review of Systems Review of Systems  Constitutional: Negative for activity change, appetite change and fatigue.  HENT: Negative for hearing loss, congestion, tinnitus and ear discharge.  dentist q65m Eyes: Negative for visual disturbance (see optho q1y -- vision corrected to 20/20 with glasses).  Respiratory: Negative for cough, chest tightness and shortness of breath.   Cardiovascular: Negative for chest pain, palpitations and leg swelling.  Gastrointestinal: Negative for abdominal pain, diarrhea, constipation and abdominal distention.  Genitourinary: Negative for urgency, frequency, decreased urine volume and difficulty urinating.  Musculoskeletal: Negative for back pain, arthralgias and gait problem.  Skin: Negative for color change, pallor and rash.  Neurological: Negative for dizziness, light-headedness, numbness and headaches.  Hematological: Negative for adenopathy. Does not bruise/bleed easily.  Psychiatric/Behavioral: Negative for suicidal ideas, confusion, sleep  disturbance, self-injury, dysphoric mood, decreased concentration and agitation.       Objective:    BP 116/70  Pulse 81  Temp(Src) 98.3 F (36.8 C) (Oral)  Ht 5' 8.5" (1.74 m)  Wt 196 lb 6.4 oz (89.086 kg)  BMI 29.43 kg/m2  SpO2 99% General appearance: alert, cooperative, appears stated age and no distress Head: Normocephalic, without obvious abnormality, atraumatic Eyes: conjunctivae/corneas clear. PERRL, EOM's intact. Fundi benign. Ears: normal TM's and external ear canals both ears Nose: Nares normal. Septum midline. Mucosa normal. No drainage or sinus tenderness. Throat: lips, mucosa, and tongue normal; teeth and gums normal Neck: no adenopathy, no carotid bruit, no JVD, supple, symmetrical, trachea midline and thyroid not enlarged, symmetric, no tenderness/mass/nodules Back: symmetric, no curvature. ROM normal. No CVA tenderness. Lungs: clear to auscultation bilaterally Breasts: deferred --later visit Heart: regular rate and rhythm, S1, S2 normal, no murmur, click, rub or gallop Abdomen: soft, non-tender; bowel sounds normal; no masses,  no organomegaly Pelvic: deferred--later visit Extremities: extremities normal, atraumatic, no cyanosis or edema Pulses: 2+ and symmetric Skin: Skin color, texture, turgor normal. No rashes or lesions Lymph nodes: Cervical, supraclavicular, and axillary nodes normal. Neurologic: Alert and oriented X 3, normal strength and tone. Normal symmetric reflexes. Normal coordination and gait psych-- no depression/ anxiety --- pt expressed inc stress and wishing to go back on pristiq   Assessment:    Healthy female exam.     depression/ anxiety--- con't wellbutrin, add pristiq Plan:  ghm utd  check labs See After Visit Summary for Counseling Recommendations

## 2011-04-25 ENCOUNTER — Encounter: Payer: Self-pay | Admitting: Gastroenterology

## 2011-05-05 ENCOUNTER — Encounter: Payer: Self-pay | Admitting: *Deleted

## 2011-05-09 ENCOUNTER — Ambulatory Visit: Payer: Medicaid Other | Admitting: Gastroenterology

## 2011-05-13 ENCOUNTER — Other Ambulatory Visit: Payer: Self-pay | Admitting: *Deleted

## 2011-05-13 MED ORDER — LISDEXAMFETAMINE DIMESYLATE 70 MG PO CAPS: 70.0000 mg | ORAL_CAPSULE | ORAL | Status: DC

## 2011-05-13 NOTE — Telephone Encounter (Signed)
Ok for #30, no refills 

## 2011-05-13 NOTE — Telephone Encounter (Signed)
VM left advising Rx ready for pick up tomorrow.      KP 

## 2011-05-13 NOTE — Telephone Encounter (Signed)
Pt left vm requesting a refill for her Vyvanse 70mg , noted last OV 04-16-11 last refill 03-12-11 #30 no refills

## 2011-05-26 ENCOUNTER — Telehealth: Payer: Self-pay | Admitting: Gastroenterology

## 2011-05-26 NOTE — Telephone Encounter (Signed)
Message copied by Arna Snipe on Mon May 26, 2011  9:55 AM ------      Message from: Harlow Mares D      Created: Mon May 26, 2011  8:45 AM       Please bill no show fee for 05/09/2011, per Dr Jarold Motto

## 2011-06-23 ENCOUNTER — Other Ambulatory Visit: Payer: Self-pay | Admitting: Family Medicine

## 2011-06-23 MED ORDER — LISDEXAMFETAMINE DIMESYLATE 70 MG PO CAPS: 70.0000 mg | ORAL_CAPSULE | ORAL | Status: DC

## 2011-06-23 NOTE — Telephone Encounter (Signed)
Vm left making aware Rx ready for pick up.     KP

## 2011-06-23 NOTE — Telephone Encounter (Signed)
Refill for Vyvanse 70mg , per call from patient will be in town tomorrow morning, would like to pick up then  Patient ph# [phone] Last OV 03.27.13

## 2011-06-27 ENCOUNTER — Ambulatory Visit (INDEPENDENT_AMBULATORY_CARE_PROVIDER_SITE_OTHER): Payer: Medicaid Other | Admitting: Family Medicine

## 2011-06-27 ENCOUNTER — Encounter: Payer: Self-pay | Admitting: Internal Medicine

## 2011-06-27 ENCOUNTER — Encounter: Payer: Self-pay | Admitting: Family Medicine

## 2011-06-27 VITALS — BP 114/70 | HR 95 | Temp 98.3°F | Wt 183.6 lb

## 2011-06-27 DIAGNOSIS — T7840XA Allergy, unspecified, initial encounter: Secondary | ICD-10-CM

## 2011-06-27 MED ORDER — PREDNISONE 10 MG PO TABS: ORAL_TABLET | ORAL | Status: DC

## 2011-06-27 MED ORDER — MOXIFLOXACIN HCL 0.5 % OP SOLN: 1.0000 [drp] | Freq: Three times a day (TID) | OPHTHALMIC | Status: AC

## 2011-06-27 MED ORDER — METHYLPREDNISOLONE ACETATE 80 MG/ML IJ SUSP
80.0000 mg | Freq: Once | INTRAMUSCULAR | Status: AC
  Administered 2011-06-27: 80 mg via INTRAMUSCULAR

## 2011-06-27 NOTE — Progress Notes (Signed)
  Subjective:    Patient ID: Mary [name], female    DOB: Jan 23, 1970, 41 y.o.   MRN: [id number]  HPI Pt here 4 days after having permanent makeup put on her eyes.  She was doing great until yesterday when she put the petroleum jelly on her lips as instructed by the place that did the tatoo.  She shortly after that noticed her eyes with yellow d/c and turning red and then she woke up this am with eyes swollen shut.  No other complaints.   Review of Systems    as above Objective:   Physical Exam  Constitutional: She appears well-developed.  HENT:       Both eyelids swollen,  Sclera injected , some yellow d/c   Cardiovascular: Normal rate, regular rhythm and normal heart sounds.   Pulmonary/Chest: Effort normal and breath sounds normal.  Psychiatric: She has a normal mood and affect. Her behavior is normal. Judgment and thought content normal.          Assessment & Plan:

## 2011-06-27 NOTE — Patient Instructions (Signed)

## 2011-06-27 NOTE — Assessment & Plan Note (Signed)
?   To petroleum jelly Depo medrol given  pred taper  vigamox To see oph this after noon

## 2011-07-28 ENCOUNTER — Ambulatory Visit: Payer: Medicaid Other | Admitting: Internal Medicine

## 2011-08-12 ENCOUNTER — Other Ambulatory Visit: Payer: Self-pay | Admitting: *Deleted

## 2011-08-12 MED ORDER — LISDEXAMFETAMINE DIMESYLATE 70 MG PO CAPS: 70.0000 mg | ORAL_CAPSULE | ORAL | Status: DC

## 2011-08-12 NOTE — Telephone Encounter (Signed)
Rx ready for pick-up. Pt aware. 

## 2011-12-25 ENCOUNTER — Ambulatory Visit: Payer: Medicaid Other | Admitting: Internal Medicine

## 2012-02-20 ENCOUNTER — Other Ambulatory Visit: Payer: Self-pay | Admitting: Family Medicine

## 2012-02-20 ENCOUNTER — Other Ambulatory Visit: Payer: Self-pay | Admitting: Obstetrics and Gynecology

## 2012-02-20 DIAGNOSIS — Z1231 Encounter for screening mammogram for malignant neoplasm of breast: Secondary | ICD-10-CM

## 2012-03-10 ENCOUNTER — Encounter: Payer: Self-pay | Admitting: Internal Medicine

## 2012-03-10 ENCOUNTER — Ambulatory Visit (INDEPENDENT_AMBULATORY_CARE_PROVIDER_SITE_OTHER): Payer: Medicaid Other | Admitting: Internal Medicine

## 2012-03-10 VITALS — BP 106/68 | HR 87 | Ht 68.0 in | Wt 203.2 lb

## 2012-03-10 DIAGNOSIS — G47419 Narcolepsy without cataplexy: Secondary | ICD-10-CM

## 2012-03-10 MED ORDER — ALBUTEROL SULFATE HFA 108 (90 BASE) MCG/ACT IN AERS: 2.0000 | INHALATION_SPRAY | RESPIRATORY_TRACT | Status: DC | PRN

## 2012-03-10 MED ORDER — DEXTROAMPHETAMINE SULFATE ER 10 MG PO CP24: 10.0000 mg | ORAL_CAPSULE | Freq: Every day | ORAL | Status: DC

## 2012-03-10 NOTE — Progress Notes (Signed)
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 She dropped off of Pristiq as unhelpful. Adderall and Nuvigil had not seemed to help and were stopped over a year ago. Daytime sleepiness has been especially worse in the last 3-4 months.she thinks she sleeps well at night. She describes bedtime 10:30 or 11 PM with variable sleep latency. She gets up about 745 to get her son to school, but then may go back to bed for several hours. Did not make her feel better but "no choice". Caffeine has little effect. She does not think she is snoring and she denies sleepwalking or limb movement disturbance. Son has been a cause of stress, ill with hepatoblastoma. Asthma has been pretty well controlled but she wants to keep her rescue inhaler.  ROS-see HPI Constitutional:   No-   weight loss, night sweats, fevers, chills,+ fatigue, lassitude. HEENT:   No-  headaches, difficulty swallowing, tooth/dental problems, sore throat,       No-  sneezing, itching, ear ache, nasal congestion, post nasal drip,  CV:  No-   chest pain, orthopnea, PND, swelling in lower extremities, anasarca,                                  dizziness, palpitations Resp: No-   shortness of breath with exertion or at rest.              No-   productive cough,  No non-productive cough,  No- coughing up of blood.              No-   change in color of mucus.  No- wheezing.   Skin: No-   rash or lesions. GI:  No-   heartburn, indigestion, abdominal pain, nausea, vomiting,  GU:  MS:  No-   joint pain or swelling.   Neuro-     nothing unusual Psych:  No- change in mood or affect. No depression or anxiety.  No memory loss.   OBJ- Physical Exam General- Alert, Oriented, Affect-appropriate/  intermittently tearful, Distress- none physical. Medium build. Skin- rash-none, lesions- none, excoriation- none Lymphadenopathy- none Head- atraumatic            Eyes- Gross vision intact, PERRLA, conjunctivae and secretions clear            Ears- Hearing, canals-normal            Nose- Clear, no-Septal dev, mucus, polyps, erosion, perforation             Throat- Mallampati II , mucosa clear , drainage- none, tonsils- atrophic Neck- flexible , trachea midline, no stridor , thyroid nl, carotid no bruit Chest - symmetrical excursion , unlabored           Heart/CV- RRR , no murmur , no gallop  , no rub, nl s1 s2                           - JVD- none , edema- none, stasis changes- none, varices- none           Lung- clear to P&A, wheeze- none, cough- none , dullness-none, rub-  none           Chest wall-  Abd-  Br/ Gen/ Rectal- Not done, not indicated Extrem- cyanosis- none, clubbing, none, atrophy- none, strength- nl Neuro- grossly intact to observation

## 2012-03-10 NOTE — Patient Instructions (Addendum)
Script to try dexedrine 10 mg tabs. Start at 20 mg each morning. You can increase by 10 mg/ day/ week to a maximum of 60 mg daily. Don't take it late in the day, to avoid affecting your sleep.  Please call as needed  Script to refill rescue albuterol inhaler

## 2012-03-11 ENCOUNTER — Encounter: Payer: Self-pay | Admitting: Internal Medicine

## 2012-03-11 NOTE — Assessment & Plan Note (Signed)
Mild intermittent asthma. -Rescue inhaler

## 2012-03-11 NOTE — Assessment & Plan Note (Signed)
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 I suggested a trial of Dexedrine but told her if that did not work, I would refer her to get other opinions. We discussed medication interaction among her stimulants.

## 2012-03-24 ENCOUNTER — Telehealth: Payer: Self-pay | Admitting: Internal Medicine

## 2012-03-24 ENCOUNTER — Ambulatory Visit: Payer: Medicaid Other

## 2012-03-24 NOTE — Telephone Encounter (Signed)
Script to try dexedrine 10 mg tabs. Start at 20 mg each morning. You can increase by 10 mg/ day/ week to a maximum of 60 mg daily. Don't take it late in the day, to avoid affecting your sleep. --- I spoke with pt and she is requesting an rx for 20 mg instead of 10 mg. She stated as high as she can go up to is 20 mg bc it would cause her to have HA. She stated also her insurance will only cover #30. Please adv ise if okay to do so Dr. Maple Hudson thanks  Last OV 03/10/12

## 2012-03-25 MED ORDER — DEXTROAMPHETAMINE SULFATE ER 10 MG PO CP24: 10.0000 mg | ORAL_CAPSULE | Freq: Every day | ORAL | Status: DC

## 2012-03-25 NOTE — Telephone Encounter (Signed)
Per CY-Dexedrine 20 mg #30 take 1 po QD prn no refills.

## 2012-03-25 NOTE — Telephone Encounter (Signed)
Spoke with patient patient aware Rx is ready for pick up.

## 2012-04-16 ENCOUNTER — Ambulatory Visit (INDEPENDENT_AMBULATORY_CARE_PROVIDER_SITE_OTHER): Payer: Medicaid Other | Admitting: Internal Medicine

## 2012-04-16 ENCOUNTER — Encounter: Payer: Self-pay | Admitting: Internal Medicine

## 2012-04-16 VITALS — BP 122/72 | HR 93 | Ht 68.0 in | Wt 211.0 lb

## 2012-04-16 DIAGNOSIS — G4733 Obstructive sleep apnea (adult) (pediatric): Secondary | ICD-10-CM

## 2012-04-16 DIAGNOSIS — G47419 Narcolepsy without cataplexy: Secondary | ICD-10-CM

## 2012-04-16 DIAGNOSIS — G4710 Hypersomnia, unspecified: Secondary | ICD-10-CM

## 2012-04-16 MED ORDER — DEXTROAMPHETAMINE SULFATE 5 MG PO TABS: ORAL_TABLET | ORAL | Status: DC

## 2012-04-16 NOTE — Progress Notes (Signed)
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 She dropped off of Pristiq as unhelpful. Adderall and Nuvigil had not seemed to help and were stopped over a year ago. Daytime sleepiness has been especially worse in the last 3-4 months.she thinks she sleeps well at night. She describes bedtime 10:30 or 11 PM with variable sleep latency. She gets up about 745 to get her son to school, but then may go back to bed for several hours. Did not make her feel better but "no choice". Caffeine has little effect. She does not think she is snoring and she denies sleepwalking or limb movement disturbance. Son has been a cause of stress, ill with hepatoblastoma. Asthma has been pretty well controlled but she wants to keep her rescue inhaler.  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 Tried Dexedrine 10 mg capsules up to 1, 3 times a day. 4 per day caused headache. Still felt "tired".. She also remains on Vyvanse and Welbutrin. We discussed interaction of these meds and her symptom of tired versus depression versus weak.she remains under a lot of family stress. Says she is frustrated by feeling tired. She goes home and goes to bed, feeling she has not given enough attention to her son who has special needs. Thyroid has been checked again. Asthma has not been a recent problem.  ROS-see HPI Constitutional:   No-   weight loss, night sweats, fevers, chills,+ fatigue, lassitude. HEENT:   No-  headaches, difficulty swallowing, tooth/dental problems, sore throat,       No-  sneezing, itching, ear ache, nasal congestion, post nasal  drip,  CV:  No-   chest pain, orthopnea, PND, swelling in lower extremities, anasarca,                                  dizziness, palpitations Resp: No-   shortness of breath with exertion or at rest.              No-   productive cough,  No non-productive cough,  No- coughing up of blood.              No-   change in color of mucus.  No- wheezing.   Skin: No-   rash or lesions. GI:  No-   heartburn, indigestion, abdominal pain, nausea, vomiting,  GU:  MS:  No-   joint pain or swelling.   Neuro-     nothing unusual Psych:  No- change in mood or affect. + depression or anxiety.  No memory loss.   OBJ- Physical Exam General- Alert, Oriented, Affect-appropriate/ intermittently tearful, Distress- none physical. overweight. Skin- rash-none, lesions- none, excoriation- none Lymphadenopathy- none Head- atraumatic            Eyes- Gross vision intact, PERRLA, conjunctivae and secretions clear            Ears- Hearing, canals-normal            Nose- Clear, no-Septal dev, mucus, polyps, erosion, perforation  Throat- Mallampati II , mucosa clear , drainage- none, tonsils- atrophic Neck- flexible , trachea midline, no stridor , thyroid nl, carotid no bruit Chest - symmetrical excursion , unlabored           Heart/CV- RRR , no murmur , no gallop  , no rub, nl s1 s2                           - JVD- none , edema- none, stasis changes- none, varices- none           Lung- clear to P&A, wheeze- none, cough- none , dullness-none, rub- none           Chest wall-  Abd-  Br/ Gen/ Rectal- Not done, not indicated Extrem- cyanosis- none, clubbing, none, atrophy- none, strength- nl Neuro- grossly intact to observation

## 2012-04-16 NOTE — Patient Instructions (Addendum)
Script for dexedrine to take up to 8 tabs daily if needed  Order- schedule NPSG followed by MSLT    Dx hypersomnolence, narcolepsy w/o cataplexy do not take dexedrine on the days of these two sleep studies. If you can, please skip wellbutrin and vyvanse on the day of the daytime Multiple Sleep Latency Test.

## 2012-04-24 NOTE — Assessment & Plan Note (Signed)
Depression may be more important than an organic sleep disturbance but it has been 5 years since she was tested. With persistent complaints, she agrees to update her sleep studies including multiple sleep latency test. I had offered referral to mental health and referral to a [facility] sleep [facility]. She does not want referral at this time. Referred to experiment longer with doses of Dexedrine. We discussed this carefully again. We again reviewed her responsibility to drive safely and the basics of sleep hygiene.

## 2012-04-24 NOTE — Assessment & Plan Note (Signed)
She agrees to be rechecked sleep study.

## 2012-04-26 ENCOUNTER — Ambulatory Visit: Payer: Medicaid Other

## 2012-05-11 ENCOUNTER — Ambulatory Visit (HOSPITAL_BASED_OUTPATIENT_CLINIC_OR_DEPARTMENT_OTHER): Payer: Medicaid Other | Attending: Internal Medicine | Admitting: Radiology

## 2012-05-11 VITALS — Ht 68.0 in | Wt 200.0 lb

## 2012-05-11 DIAGNOSIS — G47419 Narcolepsy without cataplexy: Secondary | ICD-10-CM | POA: Insufficient documentation

## 2012-05-11 DIAGNOSIS — G4710 Hypersomnia, unspecified: Secondary | ICD-10-CM | POA: Insufficient documentation

## 2012-05-12 ENCOUNTER — Ambulatory Visit (HOSPITAL_BASED_OUTPATIENT_CLINIC_OR_DEPARTMENT_OTHER): Payer: Medicaid Other | Attending: Internal Medicine | Admitting: Radiology

## 2012-05-12 VITALS — Ht 68.0 in | Wt 200.0 lb

## 2012-05-12 DIAGNOSIS — G4710 Hypersomnia, unspecified: Secondary | ICD-10-CM

## 2012-05-12 DIAGNOSIS — G47419 Narcolepsy without cataplexy: Secondary | ICD-10-CM | POA: Insufficient documentation

## 2012-05-15 DIAGNOSIS — G47419 Narcolepsy without cataplexy: Secondary | ICD-10-CM

## 2012-05-15 DIAGNOSIS — R0989 Other specified symptoms and signs involving the circulatory and respiratory systems: Secondary | ICD-10-CM

## 2012-05-15 DIAGNOSIS — G4730 Sleep apnea, unspecified: Secondary | ICD-10-CM

## 2012-05-15 DIAGNOSIS — R0609 Other forms of dyspnea: Secondary | ICD-10-CM

## 2012-05-15 DIAGNOSIS — G4710 Hypersomnia, unspecified: Secondary | ICD-10-CM

## 2012-05-16 NOTE — Procedures (Signed)
Mary [name], Mary [name]               ACCOUNT NO.:  [id number]  MEDICAL RECORD NO.:  [id number]          PATIENT TYPE:  OUT  LOCATION:  SLEEP CENTER                 FACILITY:  [facility]  PHYSICIAN:  Evelise Reine D. Maple Hudson, MD, FCCP, FACPDATE OF BIRTH:  29-Aug-1971  DATE OF STUDY:  05/11/2012                           NOCTURNAL POLYSOMNOGRAM  REFERRING PHYSICIAN:  Genisis Sonnier D. Kennedee Kitzmiller, MD, FCCP, FACP  INDICATION FOR STUDY:  Hypersomnia with sleep apnea.  EPWORTH SLEEPINESS SCORE:  20/24.  BMI 30.4, weight 200 pounds, height 68 inches, neck 13.5 inches.  MEDICATIONS:  Home medications are charted and reviewed.  A baseline diagnostic NPSG on August 16, 2009, had recorded AHI 5.7 per hour with body weight 185 pounds.  She had been treated subsequently for narcolepsy.  SLEEP ARCHITECTURE:  Total sleep time 390 minutes with sleep efficiency 76.8%.  Stage I was 14.6%, stage II 69.6%, stage III 0.8%, REM 15% of total sleep time.  Sleep latency 31 minutes and REM latency 209.5 minutes, awake after sleep onset 85.5 minutes, arousal index 10.6.  BEDTIME MEDICATION:  None.  RESPIRATORY DATA:  Apnea-hypopnea index (AHI) 0.8 per hour.  A total of 5 events were scored including 4 obstructive apneas and 1 hypopnea. Events were recorded in supine sleep position and REM.  REM AHI 5.1 per hour.  CPAP was not done.  OXYGEN DATA:  Mild snoring with oxygen desaturation to a nadir of 94% and mean oxygen saturation through the study of 97.1% on room air.  CARDIAC DATA:  Sinus rhythm with PACs.  MOVEMENT-PARASOMNIA:  No significant movement disturbance.  No bathroom trips.  IMPRESSIONS-RECOMMENDATIONS: 1. Insignificant respiratory sleep disturbance, within normal limits,     AHI 0.8 per hour (the normal range for adults is between 0 and 5     respiratory events per hour).  Mild snoring with oxygen     desaturation to a nadir of 94% and     mean oxygen saturation through the study of 97.1% on room air. 2. See  results of multiple sleep latency test, which followed this     study.     Mary Jeanmarie D. Maple Hudson, MD, [facility], FACP Diplomate, American Board of Sleep Medicine    CDY/MEDQ  D:  05/15/2012 09:13:50  T:  05/16/2012 06:40:27  Job:  [id number]

## 2012-05-16 NOTE — Procedures (Signed)
[name], [name]               ACCOUNT NO.:  [id number]  MEDICAL RECORD NO.:  [id number]          PATIENT TYPE:  OUT  LOCATION:  SLEEP CENTER                 FACILITY:  [facility]  PHYSICIAN:  Alvira Hecht D. Maple Hudson, MD, FCCP, FACPDATE OF BIRTH:  07[birth year]09/1971  DATE OF STUDY:  05/12/2012                         MULTIPLE SLEEP LATENCY TEST  REFERRING PHYSICIAN:  Braelyn Bordonaro D. Kambra Beachem, MD, FCCP, FACP  INDICATION FOR STUDY:  Narcolepsy without cataplexy.  EPWORTH SLEEPINESS SCORE:  20/24.  BMI:  30.  Weight 200 pounds, height 5 feet 8 inches, neck 13.5 inches.  MEDICATIONS:  Home medications are charted, but the patient did not take home medications with the study.  A nocturnal polysomnogram was done the night before on May 11, 2012, recording AHI 0.8 per hour with 390 minutes total sleep time of which 85% was non-REM and 15% was REM.  NAP 1:  8 a.m., sleep latency 2.5 minutes, REM latency 13.5 minutes.  NAP 2:  10 a.m., sleep latency 0, REM latency 6 minutes.  NAP 3:  12 noon, sleep latency 0, REM latency NA.  NAP 4:  2 p.m., sleep latency 2 minutes, REM latency NA.  NAP 5:  4 p.m., sleep latency 2.5 minutes, REM latency NA.   MEAN SLEEP LATENCY:  1.4 minutes with REM on 2/5 naps.  NUMBER OF REM EPISODES:  COMMENTS:  IMPRESSIONS-RECOMMENDATIONS:  These results are strongly consistent with narcolepsy.  Adequate sleep was recorded during NPSG the night before to exclude chronic sleep deprivation as a basis for these results.  Her last dose of Vyvanse was on May 10, 2012.     [name] D. Maple Hudson, MD, [facility], FACP Diplomate, American Board of Sleep Medicine    CDY/MEDQ  D:  05/15/2012 09:22:54  T:  05/16/2012 06:41:32  Job:  [id number]

## 2012-05-28 ENCOUNTER — Ambulatory Visit (INDEPENDENT_AMBULATORY_CARE_PROVIDER_SITE_OTHER): Payer: Medicaid Other | Admitting: Internal Medicine

## 2012-05-28 ENCOUNTER — Encounter: Payer: Self-pay | Admitting: Internal Medicine

## 2012-05-28 VITALS — BP 110/82 | HR 89 | Ht 68.0 in | Wt 200.0 lb

## 2012-05-28 DIAGNOSIS — G4733 Obstructive sleep apnea (adult) (pediatric): Secondary | ICD-10-CM

## 2012-05-28 DIAGNOSIS — G47419 Narcolepsy without cataplexy: Secondary | ICD-10-CM

## 2012-05-28 NOTE — Progress Notes (Signed)
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 She dropped off of Pristiq as unhelpful. Adderall and Nuvigil had not seemed to help and were stopped over a year ago. Daytime sleepiness has been especially worse in the last 3-4 months.she thinks she sleeps well at night. She describes bedtime 10:30 or 11 PM with variable sleep latency. She gets up about 745 to get her son to school, but then may go back to bed for several hours. Did not make her feel better but "no choice". Caffeine has little effect. She does not think she is snoring and she denies sleepwalking or limb movement disturbance. Son has been a cause of stress, ill with hepatoblastoma. Asthma has been pretty well controlled but she wants to keep her rescue inhaler.  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 Tried Dexedrine 10 mg capsules up to 1, 3 times a day. 4 per day caused headache. Still felt "tired".. She also remains on Vyvanse and Welbutrin. We discussed interaction of these meds and her symptom of tired versus depression versus weak.she remains under a lot of family stress. Says she is frustrated by feeling tired. She goes home and goes to bed, feeling she has not given enough attention to her son who has special needs. Thyroid has been checked again. Asthma has not been a recent problem.  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 Daytime sleepiness remains oppressive despite  medication. Dexedrine has not helped. Vyvanse 70 mg helps some if taken with Wellbutrin 300 mg in the morning. She indicates her primary physician now is Dr. Cameron Sprang. NPSG-05/11/12- AHI 0.8 per hour, 390 minutes sleep, 15% REM, Epworth sleepiness score 20/24 MSLT 05/12/12- Mean Sleep Latency 1.4 minutes, SOREM 2/5 naps These scores are strongly consistent with narcolepsy.  ROS-see HPI Constitutional:   No-   weight loss, night sweats, fevers, chills,+ fatigue, lassitude. HEENT:   No-  headaches, difficulty swallowing, tooth/dental problems, sore throat,       No-  sneezing, itching, ear ache, nasal congestion, post nasal drip,  CV:  No-   chest pain, orthopnea, PND, swelling in lower extremities, anasarca,                                  dizziness, palpitations Resp: No-   shortness of breath with exertion or at rest.              No-   productive cough,  No non-productive cough,  No- coughing up of blood.              No-   change in color of mucus.  No- wheezing.   Skin: No-   rash or lesions. GI:  No-   heartburn, indigestion, abdominal pain, nausea, vomiting,  GU:  MS:  No-   joint pain or swelling.   Neuro-     nothing unusual Psych:  No- change in mood or affect. + depression or anxiety.  No memory loss.   OBJ- Physical Exam General- Alert, Oriented, Affect-appropriate/ discouraged, Distress- none physical. overweight. Skin- rash-none, lesions- none, excoriation- none Lymphadenopathy- none Head- atraumatic            Eyes- Gross vision intact, PERRLA, conjunctivae and secretions clear            Ears- Hearing, canals-normal            Nose- Clear, no-Septal dev, mucus, polyps, erosion, perforation             Throat- Mallampati II , mucosa clear , drainage- none, tonsils- atrophic Neck- flexible , trachea midline, no stridor , thyroid nl, carotid no bruit Chest - symmetrical excursion , unlabored           Heart/CV- RRR , no murmur , no gallop  , no rub, nl s1 s2                            - JVD- none , edema- none, stasis changes- none, varices- none           Lung- clear to P&A, wheeze- none, cough- none , dullness-none, rub- none           Chest wall-  Abd-  Br/ Gen/ Rectal- Not done, not indicated Extrem- cyanosis- none, clubbing, none, atrophy- none, strength- nl Neuro- grossly intact to observation. No tremor

## 2012-05-28 NOTE — Patient Instructions (Addendum)
Order- [facility]- Help contact Social Security Disability evaluation  Ok to continue Vyvanse and Welbutirn

## 2012-05-31 ENCOUNTER — Ambulatory Visit
Admission: RE | Admit: 2012-05-31 | Discharge: 2012-05-31 | Disposition: A | Discharge status: Home / Self Care | Payer: Medicaid Other | Source: Ambulatory Visit | Attending: Family Medicine | Admitting: Family Medicine

## 2012-05-31 DIAGNOSIS — Z1231 Encounter for screening mammogram for malignant neoplasm of breast: Secondary | ICD-10-CM

## 2012-06-09 NOTE — Assessment & Plan Note (Signed)
These sleep studies are consistent between 2014 and 1999, consistent with a diagnosis of narcolepsy. She is severely limited by daytime sleepiness and has responded very little to available stimulant medications she does take frequent naps and is careful about driving is trying to raise a special needs child by herself. I would support a determination of Social Security disability for her. She is going to look into it

## 2012-06-09 NOTE — Assessment & Plan Note (Signed)
Insignificant sleep apnea. Her narcolepsy pattern is much more important. We did review basic good sleep hygiene

## 2012-06-29 ENCOUNTER — Telehealth: Payer: Self-pay | Admitting: Internal Medicine

## 2012-06-29 NOTE — Telephone Encounter (Signed)
LMTCB

## 2012-06-30 NOTE — Telephone Encounter (Signed)
LMOMTCB x 1 

## 2012-06-30 NOTE — Telephone Encounter (Signed)
Pt returned triage's call.  Mary [name] ° °

## 2012-06-30 NOTE — Telephone Encounter (Signed)
lmomtcb  

## 2012-06-30 NOTE — Telephone Encounter (Signed)
Spoke with patient-states that she will need a letter from [facility] stating why she is considered disabled due to narcolepsy and any supporting evidence for disablity office. Pt would like to pick up letter when ready; pt will be out of town next week.

## 2012-07-01 ENCOUNTER — Encounter: Payer: Self-pay | Admitting: Internal Medicine

## 2012-07-01 NOTE — Telephone Encounter (Signed)
Letter has been completed and ready for pick up. Left message on voicemail that letter at front desk; if any questions or concerns to please call our office.

## 2012-07-20 ENCOUNTER — Ambulatory Visit (INDEPENDENT_AMBULATORY_CARE_PROVIDER_SITE_OTHER): Payer: Medicaid Other | Admitting: Internal Medicine

## 2012-07-20 ENCOUNTER — Encounter: Payer: Self-pay | Admitting: Internal Medicine

## 2012-07-20 VITALS — BP 108/62 | HR 73 | Ht 68.0 in | Wt 197.2 lb

## 2012-07-20 DIAGNOSIS — G47419 Narcolepsy without cataplexy: Secondary | ICD-10-CM

## 2012-07-20 MED ORDER — METHYLPHENIDATE HCL 5 MG PO TABS: ORAL_TABLET | ORAL | Status: DC

## 2012-07-20 NOTE — Progress Notes (Signed)
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 She dropped off of Pristiq as unhelpful. Adderall and Nuvigil had not seemed to help and were stopped over a year ago. Daytime sleepiness has been especially worse in the last 3-4 months.she thinks she sleeps well at night. She describes bedtime 10:30 or 11 PM with variable sleep latency. She gets up about 745 to get her son to school, but then may go back to bed for several hours. Did not make her feel better but "no choice". Caffeine has little effect. She does not think she is snoring and she denies sleepwalking or limb movement disturbance. Son has been a cause of stress, ill with hepatoblastoma. Asthma has been pretty well controlled but she wants to keep her rescue inhaler.  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 Tried Dexedrine 10 mg capsules up to 1, 3 times a day. 4 per day caused headache. Still felt "tired".. She also remains on Vyvanse and Welbutrin. We discussed interaction of these meds and her symptom of tired versus depression versus weak.she remains under a lot of family stress. Says she is frustrated by feeling tired. She goes home and goes to bed, feeling she has not given enough attention to her son who has special needs. Thyroid has been checked again. Asthma has not been a recent problem.  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 Daytime sleepiness remains oppressive despite  medication. Dexedrine has not helped. Vyvanse 70 mg helps some if taken with Wellbutrin 300 mg in the morning. She indicates her primary physician now is Dr. Cameron Sprang. NPSG-05/11/12- AHI 0.8 per hour, 390 minutes sleep, 15% REM, Epworth sleepiness score 20/24 MSLT 05/12/12- Mean Sleep Latency 1.4 minutes, SOREM 2/5 naps These scores are strongly consistent with narcolepsy.  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 Stimulants did not help-Adderall, rectal and, Nuvigil, Dexedrine She is applying for disability which I think is appropriate. Thyroid function was rechecked. She denies pregnancy or liver disease.  ROS-see HPI Constitutional:   No-   weight loss, night sweats, fevers, chills,+ fatigue, lassitude. HEENT:   No-  headaches, difficulty swallowing, tooth/dental problems, sore throat,       No-  sneezing, itching, ear ache, nasal congestion, post nasal drip,  CV:  No-   chest pain, orthopnea, PND, swelling in lower extremities, anasarca,                                  dizziness, palpitations Resp: No-   shortness of breath with exertion or at rest.              No-   productive cough,  No non-productive cough,  No- coughing up of blood.  No-   change in color of mucus.  No- wheezing.   Skin: No-   rash or lesions. GI:  No-   heartburn, indigestion, abdominal pain, nausea, vomiting,  GU:  MS:  No-   joint pain or swelling.   Neuro-     nothing unusual Psych:  No- change in mood or affect. + depression or anxiety.  No memory loss.   OBJ- Physical Exam General- Alert, Oriented, Affect-appropriate/ discouraged, Distress- none physical. overweight. Skin- rash-none, lesions- none, excoriation- none. + tanned Lymphadenopathy- none Head- atraumatic            Eyes- Gross vision intact, PERRLA,  conjunctivae and secretions clear            Ears- Hearing, canals-normal            Nose- Clear, no-Septal dev, mucus, polyps, erosion, perforation             Throat- Mallampati II , mucosa clear , drainage- none, tonsils- atrophic Neck- flexible , trachea midline, no stridor , thyroid nl, carotid no bruit Chest - symmetrical excursion , unlabored           Heart/CV- RRR , no murmur , no gallop  , no rub, nl s1 s2                           - JVD- none , edema- none, stasis changes- none, varices- none           Lung- clear to P&A, wheeze- none, cough- none , dullness-none, rub- none           Chest wall-  Abd-  Br/ Gen/ Rectal- Not done, not indicated Extrem- cyanosis- none, clubbing, none, atrophy- none, strength- nl Neuro- grossly intact to observation. No tremor

## 2012-07-20 NOTE — Patient Instructions (Addendum)
Retry Ritalin Continue attention to good sleep habits. Take naps when you need them. Don't drive if drowsy.

## 2012-08-04 NOTE — Assessment & Plan Note (Signed)
Consider Xyrem Plan-retry Ritalin as discussed. Continue attention to extra naps and good sleep hygiene. I support her application for disability because I don't believe this condition will get better.

## 2012-09-23 ENCOUNTER — Encounter: Payer: Self-pay | Admitting: Internal Medicine

## 2012-09-23 ENCOUNTER — Ambulatory Visit (INDEPENDENT_AMBULATORY_CARE_PROVIDER_SITE_OTHER): Payer: Medicaid Other | Admitting: Internal Medicine

## 2012-09-23 VITALS — BP 110/70 | HR 88 | Ht 68.0 in | Wt 193.6 lb

## 2012-09-23 DIAGNOSIS — G4733 Obstructive sleep apnea (adult) (pediatric): Secondary | ICD-10-CM

## 2012-09-23 DIAGNOSIS — G47419 Narcolepsy without cataplexy: Secondary | ICD-10-CM

## 2012-09-23 MED ORDER — METHYLPHENIDATE HCL 5 MG PO TABS: ORAL_TABLET | ORAL | Status: DC

## 2012-09-23 MED ORDER — LISDEXAMFETAMINE DIMESYLATE 70 MG PO CAPS: 70.0000 mg | ORAL_CAPSULE | ORAL | Status: DC

## 2012-09-23 NOTE — Progress Notes (Signed)
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 She dropped off of Pristiq as unhelpful. Adderall and Nuvigil had not seemed to help and were stopped over a year ago. Daytime sleepiness has been especially worse in the last 3-4 months.she thinks she sleeps well at night. She describes bedtime 10:30 or 11 PM with variable sleep latency. She gets up about 745 to get her son to school, but then may go back to bed for several hours. Did not make her feel better but "no choice". Caffeine has little effect. She does not think she is snoring and she denies sleepwalking or limb movement disturbance. Son has been a cause of stress, ill with hepatoblastoma. Asthma has been pretty well controlled but she wants to keep her rescue inhaler.  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 Tried Dexedrine 10 mg capsules up to 1, 3 times a day. 4 per day caused headache. Still felt "tired".. She also remains on Vyvanse and Welbutrin. We discussed interaction of these meds and her symptom of tired versus depression versus weak.she remains under a lot of family stress. Says she is frustrated by feeling tired. She goes home and goes to bed, feeling she has not given enough attention to her son who has special needs. Thyroid has been checked again. Asthma has not been a recent problem.  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 Daytime sleepiness remains oppressive despite  medication. Dexedrine has not helped. Vyvanse 70 mg helps some if taken with Wellbutrin 300 mg in the morning. She indicates her primary physician now is Dr. Cameron Sprang. NPSG-05/11/12- AHI 0.8 per hour, 390 minutes sleep, 15% REM, Epworth sleepiness score 20/24 MSLT 05/12/12- Mean Sleep Latency 1.4 minutes, SOREM 2/5 naps These scores are strongly consistent with narcolepsy.  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 Stimulants did not help-Adderall, rectal and, Nuvigil, Dexedrine She is applying for disability which I think is appropriate. Thyroid function was rechecked. She denies pregnancy or liver disease.  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  Also, tired and fatigued through out the day Does best if she takes Vyvanse with an antidepressant around noon. Sleepy by 6 PM. Having difficulty with disability application, asks a letter.  ROS-see HPI Constitutional:   No-   weight loss, night sweats, fevers, chills,+ fatigue, lassitude. HEENT:   No-  headaches, difficulty swallowing, tooth/dental problems, sore throat,       No-  sneezing, itching, ear ache, nasal congestion, post nasal drip,  CV:  No-   chest pain, orthopnea, PND, swelling in lower extremities, anasarca, dizziness, palpitations Resp: No-   shortness of breath with exertion or at rest.  No-   productive cough,  No non-productive cough,  No- coughing up of blood.              No-   change in color of mucus.  No- wheezing.   Skin: No-   rash or lesions. GI:  No-   heartburn, indigestion, abdominal pain, nausea, vomiting,  GU:  MS:  No-   joint pain or swelling.   Neuro-     nothing unusual Psych:  No- change  in mood or affect. + depression or anxiety.  No memory loss.  OBJ- Physical Exam General- Alert, Oriented, Affect-appropriate/ discouraged, Distress- none physical. overweight. Skin- rash-none, lesions- none, excoriation- none. + tanned Lymphadenopathy- none Head- atraumatic            Eyes- Gross vision intact, PERRLA, conjunctivae and secretions clear            Ears- Hearing, canals-normal            Nose- Clear, no-Septal dev, mucus, polyps, erosion, perforation             Throat- Mallampati II , mucosa clear , drainage- none, tonsils- atrophic Neck- flexible , trachea midline, no stridor , thyroid nl, carotid no bruit Chest - symmetrical excursion , unlabored           Heart/CV- RRR , no murmur , no gallop  , no rub, nl s1 s2                           - JVD- none , edema- none, stasis changes- none, varices- none           Lung- clear to P&A, wheeze- none, cough- none , dullness-none, rub- none           Chest wall-  Abd-  Br/ Gen/ Rectal- Not done, not indicated Extrem- cyanosis- none, clubbing, none, atrophy- none, strength- nl Neuro- grossly intact to observation. No tremor

## 2012-09-23 NOTE — Patient Instructions (Addendum)
Try taking Vyvanse earlier- maybe 8-9 AM. When it wears off, try taking 1-3 of your left-over Ritalin 5 mg tabs. See what that is like. Call as needed  [facility] Flu vax

## 2012-10-02 ENCOUNTER — Encounter: Payer: Self-pay | Admitting: Internal Medicine

## 2012-10-02 NOTE — Assessment & Plan Note (Signed)
NPSG-05/11/12- AHI 0.8 per hour, 390 minutes sleep, 15% REM, Epworth sleepiness score 20/24  MSLT 05/12/12- Mean Sleep Latency 1.4 minutes, SOREM 2/5 naps  These scores are strongly consistent with narcolepsy. She has been resistant to stimulant medications we have tried. Doing the best now on Vyvanse plus antidepressant, with at least 2 naps daily. We are going to let her try supplementing with Ritalin using up her remaining supply. Medications discussed and questions answered. Letter of support for her disability status.

## 2012-10-02 NOTE — Assessment & Plan Note (Signed)
Formal sleep studies are consistent with narcolepsy, not with sleep disordered breathing. This problem is resolved.

## 2012-11-03 ENCOUNTER — Telehealth: Payer: Self-pay | Admitting: Internal Medicine

## 2012-11-03 MED ORDER — LISDEXAMFETAMINE DIMESYLATE 70 MG PO CAPS: 70.0000 mg | ORAL_CAPSULE | ORAL | Status: DC

## 2012-11-03 NOTE — Telephone Encounter (Signed)
Rx printed and signed by CY. I have placed in mail(to her home address on file). Nothing more needed. Will sign off on message.

## 2012-11-11 ENCOUNTER — Telehealth: Payer: Self-pay | Admitting: Internal Medicine

## 2012-11-11 NOTE — Telephone Encounter (Signed)
lmomtcb x1 for pt 

## 2012-11-15 MED ORDER — AMPHETAMINE-DEXTROAMPHET ER 20 MG PO CP24: 20.0000 mg | ORAL_CAPSULE | Freq: Two times a day (BID) | ORAL | Status: DC

## 2012-11-15 NOTE — Telephone Encounter (Signed)
Please advise if it can be the ER or just regular? Thanks   --pt also wants this mailed to her--no call back to her is needed

## 2012-11-15 NOTE — Telephone Encounter (Signed)
Rx printed, signed by CY, and mailed to patient.  

## 2012-11-15 NOTE — Telephone Encounter (Signed)
OK instead of Adderall 10 mg Give Adderall ER 20 mg, # 20, 1 twice daily as needed, for trial- ok to mail or pick up

## 2012-11-15 NOTE — Telephone Encounter (Signed)
I spoke with pt. She read on aderrall ER and thinks this might help with her narcolepsy. She has been trying the ritalin and it does not seem to be helping. Please advise Dr. Maple Hudson thanks Last OV 09/23/12/ Pending 01/28/13 Allergies  Allergen Reactions  . Pristiq [Desvenlafaxine]     headaches

## 2012-11-15 NOTE — Telephone Encounter (Signed)
Ok to try Adderall 10 mg, # 30, 1 or 2 twice daily if needed for trial, instead of Ritalin She will need to come pick up printed script

## 2012-11-15 NOTE — Telephone Encounter (Signed)
LMTCBx2. Jamyra Zweig, CMA  

## 2012-11-25 ENCOUNTER — Other Ambulatory Visit: Payer: Self-pay

## 2012-12-21 ENCOUNTER — Telehealth: Payer: Self-pay | Admitting: Internal Medicine

## 2012-12-21 NOTE — Telephone Encounter (Signed)
LMTCB-need to confirm patients home mailing address.

## 2012-12-22 NOTE — Telephone Encounter (Signed)
LMTCB to confirm address per [facility]. Carron Curie, CMA

## 2012-12-23 MED ORDER — LISDEXAMFETAMINE DIMESYLATE 70 MG PO CAPS: 70.0000 mg | ORAL_CAPSULE | ORAL | Status: DC

## 2012-12-23 NOTE — Telephone Encounter (Signed)
Last OV 09-23-12, last refill 11-03-12. Address verified. Rx printed and placed on CY cart to sign. Pt is aware.Carron Curie, CMA

## 2013-01-12 ENCOUNTER — Telehealth: Payer: Self-pay | Admitting: Internal Medicine

## 2013-01-12 NOTE — Telephone Encounter (Signed)
lmomtcb x1 for pt 

## 2013-01-12 NOTE — Telephone Encounter (Signed)
I spoke with pt. She reports she just found out she is pregnant-almost 7 weeks. She was on vyvanse and adderall. She wants to know if this is safe for her to take and if not what can she take in place of this. She has tried calling her OBGYN but can't get in with anyone. She called PCP pt reports and was told to call us. Please advise Dr. Maple Hudson thanks

## 2013-01-12 NOTE — Telephone Encounter (Signed)
Both Adderall and Vyvanse are listed as Pregnancy category C. Animal studies have shown some fetal effects, but no formal studies or human data available. Try to stop/ minimize doses taken. Decision by mother balancing need of mother against possible risk to fetus.

## 2013-01-13 ENCOUNTER — Inpatient Hospital Stay (HOSPITAL_COMMUNITY)
Admission: AD | Admit: 2013-01-13 | Discharge: 2013-01-13 | Disposition: A | Discharge status: Home / Self Care | Payer: Medicaid Other | Source: Ambulatory Visit | Attending: Obstetrics & Gynecology | Admitting: Obstetrics & Gynecology

## 2013-01-13 ENCOUNTER — Inpatient Hospital Stay (HOSPITAL_COMMUNITY): Payer: Medicaid Other

## 2013-01-13 ENCOUNTER — Encounter (HOSPITAL_COMMUNITY): Payer: Self-pay

## 2013-01-13 DIAGNOSIS — O418X10 Other specified disorders of amniotic fluid and membranes, first trimester, not applicable or unspecified: Secondary | ICD-10-CM

## 2013-01-13 DIAGNOSIS — O3400 Maternal care for unspecified congenital malformation of uterus, unspecified trimester: Secondary | ICD-10-CM | POA: Insufficient documentation

## 2013-01-13 DIAGNOSIS — O09529 Supervision of elderly multigravida, unspecified trimester: Secondary | ICD-10-CM | POA: Insufficient documentation

## 2013-01-13 DIAGNOSIS — O208 Other hemorrhage in early pregnancy: Secondary | ICD-10-CM | POA: Insufficient documentation

## 2013-01-13 DIAGNOSIS — R109 Unspecified abdominal pain: Secondary | ICD-10-CM | POA: Insufficient documentation

## 2013-01-13 DIAGNOSIS — Q513 Bicornate uterus: Secondary | ICD-10-CM

## 2013-01-13 DIAGNOSIS — O468X9 Other antepartum hemorrhage, unspecified trimester: Secondary | ICD-10-CM

## 2013-01-13 LAB — URINALYSIS, ROUTINE W REFLEX MICROSCOPIC
Bilirubin Urine: NEGATIVE
Glucose, UA: NEGATIVE mg/dL
Ketones, ur: NEGATIVE mg/dL
Nitrite: NEGATIVE
Protein, ur: NEGATIVE mg/dL
Specific Gravity, Urine: 1.015 (ref 1.005–1.030)
Urobilinogen, UA: 0.2 mg/dL (ref 0.0–1.0)

## 2013-01-13 LAB — CBC
MCV: 81.9 fL (ref 78.0–100.0)
Platelets: 243 10*3/uL (ref 150–400)
RDW: 14 % (ref 11.5–15.5)
WBC: 6.4 10*3/uL (ref 4.0–10.5)

## 2013-01-13 LAB — ABO/RH: ABO/RH(D): O POS

## 2013-01-13 LAB — WET PREP, GENITAL: Yeast Wet Prep HPF POC: NONE SEEN

## 2013-01-13 NOTE — MAU Note (Signed)
Pt states here today for spotting that began today. Did note one dime sized clot this am, none since then. Only noted when wiping. Feels like she is having period cramps. Feels bloated. Denies uti s/s. No abnormal vaginal discharge.

## 2013-01-13 NOTE — MAU Provider Note (Signed)
History     CSN: [id number]  Arrival date and time: 01/13/13 1711   First Provider Initiated Contact with Patient 01/13/13 1748      Chief Complaint  Patient presents with  . Abdominal Pain  . Vaginal Bleeding   HPI [name] is a 42 y.o. [phone] at [redacted]w[redacted]d who presents to MAU today with complaint of lower abdominal pain and vaginal bleeding since this morning. The patient states that both symptoms have been off and on today. She has not needed a pad, and noted a change in bleeding from red to brown. The patient has not taken anything for pain. She also noted one small clot this morning. She denies UTI symptoms, vaginal discharge, fever, N/V/D or constipation, weakness, dizziness or fatigue.   OB History   Grav Para Term Preterm Abortions TAB SAB Ect Mult Living   3 1  1 1  1   1       Past Medical History  Diagnosis Date  . Depression   . Narcolepsy without cataplexy(347.00)     sleep studies 1999: AHI 4/hr, MSLT 4/2. 08/16/09 AHI 5.7/RDI 12.6  . Rhinitis   . Asthma     Past Surgical History  Procedure Laterality Date  . Tonsillectomy    . Uppp for osa  1999  . Myringotomy  04/04/11  . Cesarean section      Family History  Problem Relation Age of Onset  . Breast cancer    . Depression    . Colon polyps Mother   . Colon cancer Maternal Aunt   . Colon cancer Maternal Grandmother     History  Substance Use Topics  . Smoking status: Never Smoker   . Smokeless tobacco: Never Used  . Alcohol Use: No    Allergies:  Allergies  Allergen Reactions  . Pristiq [Desvenlafaxine]     headaches    No prescriptions prior to admission    Review of Systems  Constitutional: Negative for fever and malaise/fatigue.  Gastrointestinal: Positive for abdominal pain. Negative for nausea, vomiting, diarrhea and constipation.  Genitourinary: Negative for dysuria, urgency and frequency.  Neurological: Negative for dizziness, loss of consciousness and weakness.    Physical Exam   Blood pressure 128/55, pulse 68, temperature 98 F (36.7 C), temperature source Oral, resp. rate 20, height 5\' 7"  (1.702 m), weight 209 lb 2 oz (94.858 kg), last menstrual period 11/29/2012.  Physical Exam  Constitutional: She is oriented to person, place, and time. She appears well-developed and well-nourished. No distress.  HENT:  Head: Normocephalic and atraumatic.  Cardiovascular: Normal rate, regular rhythm and normal heart sounds.   Respiratory: Effort normal and breath sounds normal. No respiratory distress.  GI: Soft. Bowel sounds are normal. She exhibits no distension and no mass. There is tenderness (mild tenderness to palpation of the lower abdomen bilaterally). There is no rebound and no guarding.  Genitourinary: Uterus is not enlarged and not tender. Cervix exhibits no motion tenderness, no discharge and no friability. Right adnexum displays no mass and no tenderness. Left adnexum displays no mass and no tenderness. There is bleeding (scant brown blood noted) around the vagina. No vaginal discharge found.  Neurological: She is alert and oriented to person, place, and time.  Skin: Skin is warm and dry. No erythema.  Psychiatric: She has a normal mood and affect.  Cervix: closed, long, thick and posterior  Results for orders placed during the hospital encounter of 01/13/13 (from the past 24 hour(s))  URINALYSIS, ROUTINE  W REFLEX MICROSCOPIC     Status: None   Collection Time    01/13/13  5:25 PM      Result Value Range   Color, Urine YELLOW  YELLOW   APPearance CLEAR  CLEAR   Specific Gravity, Urine 1.015  1.005 - 1.030   pH 7.0  5.0 - 8.0   Glucose, UA NEGATIVE  NEGATIVE mg/dL   Hgb urine dipstick NEGATIVE  NEGATIVE   Bilirubin Urine NEGATIVE  NEGATIVE   Ketones, ur NEGATIVE  NEGATIVE mg/dL   Protein, ur NEGATIVE  NEGATIVE mg/dL   Urobilinogen, UA 0.2  0.0 - 1.0 mg/dL   Nitrite NEGATIVE  NEGATIVE   Leukocytes, UA NEGATIVE  NEGATIVE  POCT PREGNANCY,  URINE     Status: Abnormal   Collection Time    01/13/13  5:44 PM      Result Value Range   Preg Test, Ur POSITIVE (*) NEGATIVE  WET PREP, GENITAL     Status: Abnormal   Collection Time    01/13/13  5:59 PM      Result Value Range   Yeast Wet Prep HPF POC NONE SEEN  NONE SEEN   Trich, Wet Prep NONE SEEN  NONE SEEN   Clue Cells Wet Prep HPF POC NONE SEEN  NONE SEEN   WBC, Wet Prep HPF POC MODERATE (*) NONE SEEN  CBC     Status: Abnormal   Collection Time    01/13/13  6:09 PM      Result Value Range   WBC 6.4  4.0 - 10.5 K/uL   RBC 4.41  3.87 - 5.11 MIL/uL   Hemoglobin 11.8 (*) 12.0 - 15.0 g/dL   HCT 78.4  69.6 - 29.5 %   MCV 81.9  78.0 - 100.0 fL   MCH 26.8  26.0 - 34.0 pg   MCHC 32.7  30.0 - 36.0 g/dL   RDW 28.4  13.2 - 44.0 %   Platelets 243  150 - 400 K/uL  HCG, QUANTITATIVE, PREGNANCY     Status: Abnormal   Collection Time    01/13/13  6:09 PM      Result Value Range   hCG, Beta Chain, Quant, S 4040 (*) <5 mIU/mL  ABO/RH     Status: None   Collection Time    01/13/13  6:09 PM      Result Value Range   ABO/RH(D) O POS     US Ob Comp Less 14 Wks  01/13/2013   CLINICAL DATA:  Vaginal bleeding. Pregnancy test. Quantitative beta HCG pending.  EXAM: OBSTETRIC <14 WK ULTRASOUND  TECHNIQUE: Transabdominal ultrasound was performed for evaluation of the gestation as well as the maternal uterus and adnexal regions.  COMPARISON:  None.  FINDINGS: Intrauterine gestational sac: Present. Bicornuate uterus. Gestational sac located in left horn.  Yolk sac:  Present  Embryo:  None visualize  Cardiac Activity: None visualize  Heart Rate: Not applicable bpm  MSD: 8  mm   5 w   3  d  [place] EDC: 09/12/2013  Maternal uterus/adnexae: There is a small subchorionic hemorrhage. Neither ovary is visualized. There is no free fluid in the anatomic pelvis.  IMPRESSION: 1. 8 mm probable intrauterine gestational sac in the left horn of a bicornuate uterus. Probable early intrauterine gestational sac but no  fetal pole, or cardiac activity yet visualized. Recommend follow-up quantitative B-HCG levels and follow-up US in 14 days to confirm and assess viability. This recommendation follows SRU consensus guidelines: Diagnostic Criteria  for Nonviable Pregnancy Early in the First Trimester. Malva Limes Med 2013; 440:1027-25. 2. Small subchorionic hemorrhage.   Electronically Signed   By: Andreas Newport M.D.   On: 01/13/2013 19:03   US Ob Transvaginal  01/13/2013   CLINICAL DATA:  Vaginal bleeding. Pregnancy test. Quantitative beta HCG pending.  EXAM: OBSTETRIC <14 WK ULTRASOUND  TECHNIQUE: Transabdominal ultrasound was performed for evaluation of the gestation as well as the maternal uterus and adnexal regions.  COMPARISON:  None.  FINDINGS: Intrauterine gestational sac: Present. Bicornuate uterus. Gestational sac located in left horn.  Yolk sac:  Present  Embryo:  None visualize  Cardiac Activity: None visualize  Heart Rate: Not applicable bpm  MSD: 8  mm   5 w   3  d  [place] EDC: 09/12/2013  Maternal uterus/adnexae: There is a small subchorionic hemorrhage. Neither ovary is visualized. There is no free fluid in the anatomic pelvis.  IMPRESSION: 1. 8 mm probable intrauterine gestational sac in the left horn of a bicornuate uterus. Probable early intrauterine gestational sac but no fetal pole, or cardiac activity yet visualized. Recommend follow-up quantitative B-HCG levels and follow-up US in 14 days to confirm and assess viability. This recommendation follows SRU consensus guidelines: Diagnostic Criteria for Nonviable Pregnancy Early in the First Trimester. Malva Limes Med 2013; 366:4403-47. 2. Small subchorionic hemorrhage.   Electronically Signed   By: Andreas Newport M.D.   On: 01/13/2013 19:03    MAU Course  Procedures None  MDM +UPT UA, Wet prep, GC/Chlamydia, CBC, ABO/Rh, quant hCG and [place] today  Assessment and Plan  A: [redacted]w[redacted]d IUGS and YS Small subchorionic hemorrhage Bicornate uterus  P: Discharge  home Bleeding precautions discussed Patient referred to [facility] to start routine prenatal care Patient given pregnancy confirmation letter and Medicaid assistance information Patient may return to MAU as needed or if her condition were to change or worsen  Freddi Starr, PA-C  01/13/2013, 7:38 PM

## 2013-01-14 ENCOUNTER — Encounter (HOSPITAL_COMMUNITY): Payer: Self-pay

## 2013-01-14 ENCOUNTER — Inpatient Hospital Stay (HOSPITAL_COMMUNITY)
Admission: AD | Admit: 2013-01-14 | Discharge: 2013-01-14 | Disposition: A | Discharge status: Home / Self Care | Payer: Medicaid Other | Source: Ambulatory Visit | Attending: Obstetrics & Gynecology | Admitting: Obstetrics & Gynecology

## 2013-01-14 ENCOUNTER — Inpatient Hospital Stay (HOSPITAL_COMMUNITY): Payer: Medicaid Other

## 2013-01-14 DIAGNOSIS — O468X9 Other antepartum hemorrhage, unspecified trimester: Secondary | ICD-10-CM

## 2013-01-14 DIAGNOSIS — Z3490 Encounter for supervision of normal pregnancy, unspecified, unspecified trimester: Secondary | ICD-10-CM

## 2013-01-14 DIAGNOSIS — Q513 Bicornate uterus: Secondary | ICD-10-CM

## 2013-01-14 DIAGNOSIS — R109 Unspecified abdominal pain: Secondary | ICD-10-CM | POA: Insufficient documentation

## 2013-01-14 DIAGNOSIS — O208 Other hemorrhage in early pregnancy: Secondary | ICD-10-CM

## 2013-01-14 DIAGNOSIS — O09529 Supervision of elderly multigravida, unspecified trimester: Secondary | ICD-10-CM | POA: Insufficient documentation

## 2013-01-14 DIAGNOSIS — O3400 Maternal care for unspecified congenital malformation of uterus, unspecified trimester: Secondary | ICD-10-CM | POA: Insufficient documentation

## 2013-01-14 DIAGNOSIS — O418X10 Other specified disorders of amniotic fluid and membranes, first trimester, not applicable or unspecified: Secondary | ICD-10-CM | POA: Diagnosis present

## 2013-01-14 HISTORY — DX: Bicornate uterus: Q51.3

## 2013-01-14 LAB — GC/CHLAMYDIA PROBE AMP
CT Probe RNA: NEGATIVE
GC Probe RNA: NEGATIVE

## 2013-01-14 LAB — CBC
Hemoglobin: 11.8 g/dL — ABNORMAL LOW (ref 12.0–15.0)
MCH: 26.5 pg (ref 26.0–34.0)
MCV: 81.6 fL (ref 78.0–100.0)
RBC: 4.46 MIL/uL (ref 3.87–5.11)

## 2013-01-14 MED ORDER — OXYCODONE-ACETAMINOPHEN 5-325 MG PO TABS: 1.0000 | ORAL_TABLET | Freq: Four times a day (QID) | ORAL | Status: DC | PRN

## 2013-01-14 MED ORDER — OXYCODONE-ACETAMINOPHEN 5-325 MG PO TABS
2.0000 | ORAL_TABLET | Freq: Once | ORAL | Status: AC
  Administered 2013-01-14: 2 via ORAL
  Filled 2013-01-14: qty 2

## 2013-01-14 NOTE — MAU Note (Signed)
Patient states she was seen yesterday in MAU. Has a SCH. Started bleeding this am and cramping has gotten worse.

## 2013-01-14 NOTE — MAU Provider Note (Signed)
Chief Complaint: Vaginal Bleeding and Abdominal Cramping  First Provider Initiated Contact with Patient 01/14/13 0740     SUBJECTIVE HPI: Mary [name] is a 42 y.o. [id number] at [redacted]w[redacted]d by LMP who presents with worsening bleeding and cramping since MAU visit last night. IUP verified. Fetal pole not yet seen. Known subchorionic hemorrhage. See ultrasound report. Describes bleeding as the is heavy at the beginning of a period this morning. Cramping moderate-severe, worsening. Requesting pain medication. Hasn't taken anything for the pain and this morning.   Denies fever, chills, passage of tissue, dizziness, urinary complaints or GI complaints.   Past Medical History  Diagnosis Date  . Depression   . Narcolepsy without cataplexy(347.00)     sleep studies 1999: AHI 4/hr, MSLT 4/2. 08/16/09 AHI 5.7/RDI 12.6  . Rhinitis   . Asthma   . Bicornuate uterus    OB History  Gravida Para Term Preterm AB SAB TAB Ectopic Multiple Living  3 1  1 1 1    1     # Outcome Date GA Lbr Len/2nd Weight Sex Delivery Anes PTL Lv  3 CUR           2 PRE 2009 [redacted]w[redacted]d   M LTCS  Y Y     Comments: PPROM, nonreassuring fetal heart tones  1 SAB              Past Surgical History  Procedure Laterality Date  . Tonsillectomy    . Uppp for osa  1999  . Myringotomy  04/04/11  . Cesarean section     History   Social History  . Marital Status: Married    Spouse Name: N/A    Number of Children: N/A  . Years of Education: N/A   Occupational History  . Not on file.   Social History Main Topics  . Smoking status: Never Smoker   . Smokeless tobacco: Never Used  . Alcohol Use: No  . Drug Use: No  . Sexual Activity: Yes    Birth Control/ Protection: None   Other Topics Concern  . Not on file   Social History Narrative  . No narrative on file   No current facility-administered medications on file prior to encounter.   Current Outpatient Prescriptions on File Prior to Encounter  Medication Sig Dispense Refill   . albuterol (PROVENTIL HFA) 108 (90 BASE) MCG/ACT inhaler Inhale 2 puffs into the lungs every 4 (four) hours as needed for wheezing or shortness of breath.  1 Inhaler  prn  . Prenatal Vit-Fe Fumarate-FA (PRENATAL MULTIVITAMIN) TABS tablet Take 1 tablet by mouth daily at 12 noon.       Allergies  Allergen Reactions  . Pristiq [Desvenlafaxine]     headaches   ROS: Pertinent items in HPI  OBJECTIVE Blood pressure 119/50, pulse 71, resp. rate 16, last menstrual period 11/29/2012, SpO2 99.00%. GENERAL: Well-developed, well-nourished female in mild distress. Rocking motion. HEENT: Normocephalic HEART: normal rate RESP: normal effort ABDOMEN: Soft, non-tender EXTREMITIES: Nontender, no edema NEURO: Alert and oriented  LAB RESULTS Results for orders placed during the hospital encounter of 01/13/13 (from the past 24 hour(s))  URINALYSIS, ROUTINE W REFLEX MICROSCOPIC     Status: None   Collection Time    01/13/13  5:25 PM      Result Value Range   Color, Urine YELLOW  YELLOW   APPearance CLEAR  CLEAR   Specific Gravity, Urine 1.015  1.005 - 1.030   pH 7.0  5.0 - 8.0  Glucose, UA NEGATIVE  NEGATIVE mg/dL   Hgb urine dipstick NEGATIVE  NEGATIVE   Bilirubin Urine NEGATIVE  NEGATIVE   Ketones, ur NEGATIVE  NEGATIVE mg/dL   Protein, ur NEGATIVE  NEGATIVE mg/dL   Urobilinogen, UA 0.2  0.0 - 1.0 mg/dL   Nitrite NEGATIVE  NEGATIVE   Leukocytes, UA NEGATIVE  NEGATIVE  POCT PREGNANCY, URINE     Status: Abnormal   Collection Time    01/13/13  5:44 PM      Result Value Range   Preg Test, Ur POSITIVE (*) NEGATIVE  WET PREP, GENITAL     Status: Abnormal   Collection Time    01/13/13  5:59 PM      Result Value Range   Yeast Wet Prep HPF POC NONE SEEN  NONE SEEN   Trich, Wet Prep NONE SEEN  NONE SEEN   Clue Cells Wet Prep HPF POC NONE SEEN  NONE SEEN   WBC, Wet Prep HPF POC MODERATE (*) NONE SEEN  CBC     Status: Abnormal   Collection Time    01/13/13  6:09 PM      Result Value Range    WBC 6.4  4.0 - 10.5 K/uL   RBC 4.41  3.87 - 5.11 MIL/uL   Hemoglobin 11.8 (*) 12.0 - 15.0 g/dL   HCT 14.7  82.9 - 56.2 %   MCV 81.9  78.0 - 100.0 fL   MCH 26.8  26.0 - 34.0 pg   MCHC 32.7  30.0 - 36.0 g/dL   RDW 13.0  86.5 - 78.4 %   Platelets 243  150 - 400 K/uL  HCG, QUANTITATIVE, PREGNANCY     Status: Abnormal   Collection Time    01/13/13  6:09 PM      Result Value Range   hCG, Beta Chain, Quant, S 4040 (*) <5 mIU/mL  ABO/RH     Status: None   Collection Time    01/13/13  6:09 PM      Result Value Range   ABO/RH(D) O POS      IMAGING US Ob Comp Less 14 Wks  01/13/2013   CLINICAL DATA:  Vaginal bleeding. Pregnancy test. Quantitative beta HCG pending.  EXAM: OBSTETRIC <14 WK ULTRASOUND  TECHNIQUE: Transabdominal ultrasound was performed for evaluation of the gestation as well as the maternal uterus and adnexal regions.  COMPARISON:  None.  FINDINGS: Intrauterine gestational sac: Present. Bicornuate uterus. Gestational sac located in left horn.  Yolk sac:  Present  Embryo:  None visualize  Cardiac Activity: None visualize  Heart Rate: Not applicable bpm  MSD: 8  mm   5 w   3  d  [place] EDC: 09/12/2013  Maternal uterus/adnexae: There is a small subchorionic hemorrhage. Neither ovary is visualized. There is no free fluid in the anatomic pelvis.  IMPRESSION: 1. 8 mm probable intrauterine gestational sac in the left horn of a bicornuate uterus. Probable early intrauterine gestational sac but no fetal pole, or cardiac activity yet visualized. Recommend follow-up quantitative B-HCG levels and follow-up US in 14 days to confirm and assess viability. This recommendation follows SRU consensus guidelines: Diagnostic Criteria for Nonviable Pregnancy Early in the First Trimester. Malva Limes Med 2013; 696:2952-84. 2. Small subchorionic hemorrhage.   Electronically Signed   By: Andreas Newport M.D.   On: 01/13/2013 19:03   US Ob Transvaginal  01/13/2013   CLINICAL DATA:  Vaginal bleeding. Pregnancy test.  Quantitative beta HCG pending.  EXAM: OBSTETRIC <14 WK  ULTRASOUND  TECHNIQUE: Transabdominal ultrasound was performed for evaluation of the gestation as well as the maternal uterus and adnexal regions.  COMPARISON:  None.  FINDINGS: Intrauterine gestational sac: Present. Bicornuate uterus. Gestational sac located in left horn.  Yolk sac:  Present  Embryo:  None visualize  Cardiac Activity: None visualize  Heart Rate: Not applicable bpm  MSD: 8  mm   5 w   3  d  [place] EDC: 09/12/2013  Maternal uterus/adnexae: There is a small subchorionic hemorrhage. Neither ovary is visualized. There is no free fluid in the anatomic pelvis.  IMPRESSION: 1. 8 mm probable intrauterine gestational sac in the left horn of a bicornuate uterus. Probable early intrauterine gestational sac but no fetal pole, or cardiac activity yet visualized. Recommend follow-up quantitative B-HCG levels and follow-up US in 14 days to confirm and assess viability. This recommendation follows SRU consensus guidelines: Diagnostic Criteria for Nonviable Pregnancy Early in the First Trimester. Malva Limes Med 2013; 161:0960-45. 2. Small subchorionic hemorrhage.   Electronically Signed   By: Andreas Newport M.D.   On: 01/13/2013 19:03    MAU COURSE Transvaginal ultrasound, CBC and Percocet ordered.  Care of patient turned over to Sharen Counter, CNM at 8 AM.  Dorathy Kinsman, CNM 01/14/2013  8:00 AM    US Ob Comp Less 14 Wks  01/13/2013   CLINICAL DATA:  Vaginal bleeding. Pregnancy test. Quantitative beta HCG pending.  EXAM: OBSTETRIC <14 WK ULTRASOUND  TECHNIQUE: Transabdominal ultrasound was performed for evaluation of the gestation as well as the maternal uterus and adnexal regions.  COMPARISON:  None.  FINDINGS: Intrauterine gestational sac: Present. Bicornuate uterus. Gestational sac located in left horn.  Yolk sac:  Present  Embryo:  None visualize  Cardiac Activity: None visualize  Heart Rate: Not applicable bpm  MSD: 8  mm   5 w   3  d  [place]  EDC: 09/12/2013  Maternal uterus/adnexae: There is a small subchorionic hemorrhage. Neither ovary is visualized. There is no free fluid in the anatomic pelvis.  IMPRESSION: 1. 8 mm probable intrauterine gestational sac in the left horn of a bicornuate uterus. Probable early intrauterine gestational sac but no fetal pole, or cardiac activity yet visualized. Recommend follow-up quantitative B-HCG levels and follow-up US in 14 days to confirm and assess viability. This recommendation follows SRU consensus guidelines: Diagnostic Criteria for Nonviable Pregnancy Early in the First Trimester. Malva Limes Med 2013; 409:8119-14. 2. Small subchorionic hemorrhage.   Electronically Signed   By: Andreas Newport M.D.   On: 01/13/2013 19:03   US Ob Transvaginal  01/14/2013   CLINICAL DATA:  Worsening bleeding, cramping. Rule out spontaneous abortion.  EXAM: TRANSVAGINAL OB ULTRASOUND  TECHNIQUE: Transvaginal ultrasound was performed for complete evaluation of the gestation as well as the maternal uterus, adnexal regions, and pelvic cul-de-sac.  COMPARISON:  01/13/2013  FINDINGS: Intrauterine gestational sac: Visualized/normal in shape.  Yolk sac:  Visualized  Embryo:  Not visualized  Cardiac Activity: Not visualized  Heart Rate:  Bpm  MSD:   mm    w     d  CRL:   7.2  mm   5 w 2d                  [place] EDC: 09/14/2013  Maternal uterus/adnexae: Small subchorionic hemorrhage. Ovaries symmetric in size and echotexture. No adnexal masses. Trace free fluid in the pelvis.  IMPRESSION: Early intrauterine pregnancy again noted with small subchorionic hemorrhage. No fetal pole  or cardiac activity noted. Recommend followup serial quantitative beta HCG levels and followup ultrasound in 14 days to confirm and assess viability. This recommendation follows SRU consensus guidelines: Diagnostic Criteria for Nonviable Pregnancy Early in the First Trimester. Malva Limes Med 2013; 409:8119-14.   Electronically Signed   By: Charlett Nose M.D.   On:  01/14/2013 08:38    A: 1. Subchorionic hemorrhage in first trimester   2. Bicornuate uterus   3. Normal IUP (intrauterine pregnancy) on prenatal ultrasound     P: D/C home with bleeding precautions Outpatient U/S in 10 days Percocet 5/325, 1-2 tabs Q 6 hours PRN x10 tabs F/U in WOC  Return to MAU as needed  Sharen Counter Certified Nurse-Midwife

## 2013-01-14 NOTE — Telephone Encounter (Signed)
lmomtcb x2 for pt 

## 2013-01-15 ENCOUNTER — Inpatient Hospital Stay (HOSPITAL_COMMUNITY): Payer: Medicaid Other

## 2013-01-15 ENCOUNTER — Inpatient Hospital Stay (HOSPITAL_COMMUNITY)
Admission: AD | Admit: 2013-01-15 | Discharge: 2013-01-15 | Disposition: A | Discharge status: Home / Self Care | Payer: Medicaid Other | Source: Ambulatory Visit | Attending: Obstetrics & Gynecology | Admitting: Obstetrics & Gynecology

## 2013-01-15 ENCOUNTER — Encounter (HOSPITAL_COMMUNITY): Payer: Self-pay | Admitting: Family

## 2013-01-15 DIAGNOSIS — O3400 Maternal care for unspecified congenital malformation of uterus, unspecified trimester: Secondary | ICD-10-CM | POA: Insufficient documentation

## 2013-01-15 DIAGNOSIS — O034 Incomplete spontaneous abortion without complication: Secondary | ICD-10-CM

## 2013-01-15 DIAGNOSIS — O09529 Supervision of elderly multigravida, unspecified trimester: Secondary | ICD-10-CM | POA: Insufficient documentation

## 2013-01-15 DIAGNOSIS — R109 Unspecified abdominal pain: Secondary | ICD-10-CM | POA: Insufficient documentation

## 2013-01-15 DIAGNOSIS — O200 Threatened abortion: Secondary | ICD-10-CM | POA: Insufficient documentation

## 2013-01-15 DIAGNOSIS — Q513 Bicornate uterus: Secondary | ICD-10-CM | POA: Insufficient documentation

## 2013-01-15 MED ORDER — OXYCODONE-ACETAMINOPHEN 5-325 MG PO TABS: 2.0000 | ORAL_TABLET | ORAL | Status: DC | PRN

## 2013-01-15 MED ORDER — OXYCODONE-ACETAMINOPHEN 5-325 MG PO TABS
1.0000 | ORAL_TABLET | Freq: Once | ORAL | Status: AC
  Administered 2013-01-15: 1 via ORAL
  Filled 2013-01-15: qty 1

## 2013-01-15 NOTE — MAU Note (Signed)
42 yo, G3P1 at [redacted]w[redacted]d, presents to MAU with c/o vaginal bleeding since 12/25. Reports increased bleeding today and passed a piece of tissue with large clot today. Reports bilateral lower abdominal cramping (8/10) since yesterday.

## 2013-01-15 NOTE — MAU Provider Note (Signed)
History     CSN: [id number]  Arrival date and time: 01/15/13 4098   First Provider Initiated Contact with Patient 01/15/13 1858      No chief complaint on file.  HPI This is a 42 y.o. female at [redacted]w[redacted]d who presents with c/o vaginal bleeding and passage of tissue.  Has also had cramping since yesterday. Has been seen daily for the past 2 days and has had 2 ultrasounds which showed a Gestational sac with small subchorionic hemorrhage. Is very tearful and worried. Has a known bicornuate uterus.   RN Note: 42 yo, G3P1 at [redacted]w[redacted]d, presents to MAU with c/o vaginal bleeding since 12/25. Reports increased bleeding today and passed a piece of tissue with large clot today. Reports bilateral lower abdominal cramping (8/10) since yesterday.        OB History   Grav Para Term Preterm Abortions TAB SAB Ect Mult Living   3 1  1 1  1   1       Past Medical History  Diagnosis Date  . Depression   . Narcolepsy without cataplexy(347.00)     sleep studies 1999: AHI 4/hr, MSLT 4/2. 08/16/09 AHI 5.7/RDI 12.6  . Rhinitis   . Asthma   . Bicornuate uterus     Past Surgical History  Procedure Laterality Date  . Tonsillectomy    . Uppp for osa  1999  . Myringotomy  04/04/11  . Cesarean section      Family History  Problem Relation Age of Onset  . Breast cancer    . Depression    . Colon polyps Mother   . Colon cancer Maternal Aunt   . Colon cancer Maternal Grandmother     History  Substance Use Topics  . Smoking status: Never Smoker   . Smokeless tobacco: Never Used  . Alcohol Use: No    Allergies:  Allergies  Allergen Reactions  . Pristiq [Desvenlafaxine]     headaches    Prescriptions prior to admission  Medication Sig Dispense Refill  . albuterol (PROVENTIL HFA) 108 (90 BASE) MCG/ACT inhaler Inhale 2 puffs into the lungs every 4 (four) hours as needed for wheezing or shortness of breath.  1 Inhaler  prn  . oxyCODONE-acetaminophen (PERCOCET/ROXICET) 5-325 MG per tablet Take 1-2  tablets by mouth every 6 (six) hours as needed.  10 tablet  0  . Prenatal Vit-Fe Fumarate-FA (PRENATAL MULTIVITAMIN) TABS tablet Take 1 tablet by mouth daily at 12 noon.        Review of Systems  Constitutional: Negative for fever, chills and malaise/fatigue.  Gastrointestinal: Positive for abdominal pain. Negative for nausea, vomiting, diarrhea and constipation.  Genitourinary: Negative for dysuria.  Neurological: Negative for dizziness.   Physical Exam   Blood pressure 141/74, pulse 90, temperature 98.9 F (37.2 C), temperature source Oral, resp. rate 18, last menstrual period 11/29/2012.  Physical Exam  Constitutional: She is oriented to person, place, and time. She appears well-developed and well-nourished. No distress.  HENT:  Head: Normocephalic.  Cardiovascular: Normal rate.   Respiratory: Effort normal.  GI: Soft. She exhibits no distension and no mass. There is tenderness (lower abdomen). There is no rebound and no guarding.  Genitourinary:  Pelvic deferred due to 2 recent exams Stripe of red blood on pad Small grape sized tissue sent to path  Musculoskeletal: Normal range of motion.  Neurological: She is alert and oriented to person, place, and time.  Skin: Skin is warm and dry.  Psychiatric: She has a normal mood  and affect.    MAU Course  Procedures  MDM DIscussed with Dr Penne Lash. Will do another [place] to verify probable SAB. US Ob Comp Less 14 Wks US Ob Transvaginal  01/15/2013   CLINICAL DATA:  Passed tissue; assess for spontaneous abortion.  EXAM: TRANSVAGINAL OB ULTRASOUND  TECHNIQUE: Transvaginal ultrasound was performed for complete evaluation of the gestation as well as the maternal uterus, adnexal regions, and pelvic cul-de-sac.  COMPARISON:  Pelvic ultrasound performed 01/14/2013    FINDINGS: Intrauterine gestational sac: Visualized / irregular in appearance; the gestational sac is noted progressing distally along the lower uterine segment and cervix.  Yolk  sac:  Yes  Embryo:  No  Cardiac Activity: N/A  MSD: 7.5  mm   5 w   3  d  Maternal uterus/adnexae: The uterus is otherwise unremarkable in appearance. The ovaries are within normal limits. The right ovary measures 2.4 x 1.5 x 1.9 cm, while the left ovary measures 3.1 x 2.3 x 2.6 cm. No suspicious adnexal masses are seen; there is no definite evidence for ovarian torsion. The ovaries are difficult to fully characterize on this study.  No free fluid is seen within the pelvic cul-de-sac.    IMPRESSION: Intrauterine gestational sac noted progressing distally along the lower uterine segment and cervix, compatible with spontaneous abortion in progress. A yolk sac is seen; the embryo is not yet visualized.     Electronically Signed   By: Roanna Raider M.D.   On: 01/15/2013 21:59   Assessment and Plan  A:  SIUP with inevitable SAB      Gestational sac has moved distally into cervix      Known bicornuate uterus      AMA  P:  Patient very upset and crying, asking if there is any way the baby will be okay      Discussed progression of pregnancy down uterus with bleeding and cramping indicates inevitable AB.      Patient offered expectant management vs Cytotec, elects to go home and wait for AB to complete      Tissue sent to pathology      Bleeding precautions reviewed      Will have her followup in our clinic  [facility] 01/15/2013, 7:05 PM

## 2013-01-17 NOTE — Telephone Encounter (Signed)
I spoke with the pt and she states that she had a miscarriage over the weekend. She states she has a f/u with her OBGYN to have an ultrasound. She will continue her medications. Pt also requested that her appt on 1-6 be cancelled and she will call to r/s once she has recovered from this.  Carron Curie, CMA

## 2013-01-23 NOTE — MAU Provider Note (Signed)
Attestation of Attending Supervision of Advanced Practitioner (CNM/NP): Evaluation and management procedures were performed by the Advanced Practitioner under my supervision and collaboration. I have reviewed the Advanced Practitioner's note and chart, and I agree with the management and plan.  Josean Lycan H. 12:33 PM

## 2013-01-24 ENCOUNTER — Other Ambulatory Visit (HOSPITAL_COMMUNITY): Payer: Self-pay | Admitting: Advanced Practice Midwife

## 2013-01-24 ENCOUNTER — Ambulatory Visit (HOSPITAL_COMMUNITY)
Admit: 2013-01-24 | Discharge: 2013-01-24 | Disposition: A | Discharge status: Home / Self Care | Payer: Medicaid Other | Attending: Obstetrics & Gynecology | Admitting: Obstetrics & Gynecology

## 2013-01-24 ENCOUNTER — Inpatient Hospital Stay (HOSPITAL_COMMUNITY)
Admission: AD | Admit: 2013-01-24 | Discharge: 2013-01-24 | Disposition: A | Discharge status: Home / Self Care | Payer: Medicaid Other | Source: Ambulatory Visit | Attending: Obstetrics & Gynecology | Admitting: Obstetrics & Gynecology

## 2013-01-24 DIAGNOSIS — O418X10 Other specified disorders of amniotic fluid and membranes, first trimester, not applicable or unspecified: Secondary | ICD-10-CM

## 2013-01-24 DIAGNOSIS — O039 Complete or unspecified spontaneous abortion without complication: Secondary | ICD-10-CM | POA: Insufficient documentation

## 2013-01-24 DIAGNOSIS — Q512 Other doubling of uterus, unspecified: Secondary | ICD-10-CM

## 2013-01-24 DIAGNOSIS — O3680X0 Pregnancy with inconclusive fetal viability, not applicable or unspecified: Secondary | ICD-10-CM | POA: Insufficient documentation

## 2013-01-24 DIAGNOSIS — O468X1 Other antepartum hemorrhage, first trimester: Secondary | ICD-10-CM

## 2013-01-24 DIAGNOSIS — O99891 Other specified diseases and conditions complicating pregnancy: Secondary | ICD-10-CM | POA: Insufficient documentation

## 2013-01-24 DIAGNOSIS — O9989 Other specified diseases and conditions complicating pregnancy, childbirth and the puerperium: Principal | ICD-10-CM

## 2013-01-24 DIAGNOSIS — Q5128 Other doubling of uterus, other specified: Secondary | ICD-10-CM | POA: Insufficient documentation

## 2013-01-24 DIAGNOSIS — O3400 Maternal care for unspecified congenital malformation of uterus, unspecified trimester: Secondary | ICD-10-CM | POA: Insufficient documentation

## 2013-01-24 NOTE — MAU Note (Signed)
Patient to MAU after ultrasound. Patient denies any pain or bleeding.  

## 2013-01-24 NOTE — MAU Provider Note (Signed)
History     CSN: [id number]  Arrival date and time: 01/24/13 1152   None     Chief Complaint  Patient presents with  . Follow-up   HPI This is a 43 y.o. female who presents for results of followup ultrasound   She was seen here recently for a threatened abortion and the gestational sac was seen prograssing through her uterus to the lower segment. At that time she was counseled on inevitable SAB.  Today's ultrasound had been previously scheduled as a followup States she had some bleeding after last visit which has now stopped. Grieving.  Wants to conceive again. Son has hepatoblastoma (survived), this baby was very much wanted.   OB History   Grav Para Term Preterm Abortions TAB SAB Ect Mult Living   3 1  1 1  1   1       Past Medical History  Diagnosis Date  . Depression   . Narcolepsy without cataplexy(347.00)     sleep studies 1999: AHI 4/hr, MSLT 4/2. 08/16/09 AHI 5.7/RDI 12.6  . Rhinitis   . Asthma   . Bicornuate uterus     Past Surgical History  Procedure Laterality Date  . Tonsillectomy    . Uppp for osa  1999  . Myringotomy  04/04/11  . Cesarean section      Family History  Problem Relation Age of Onset  . Breast cancer    . Depression    . Colon polyps Mother   . Colon cancer Maternal Aunt   . Colon cancer Maternal Grandmother     History  Substance Use Topics  . Smoking status: Never Smoker   . Smokeless tobacco: Never Used  . Alcohol Use: No    Allergies:  Allergies  Allergen Reactions  . Pristiq [Desvenlafaxine]     headaches    Prescriptions prior to admission  Medication Sig Dispense Refill  . albuterol (PROVENTIL HFA) 108 (90 BASE) MCG/ACT inhaler Inhale 2 puffs into the lungs every 4 (four) hours as needed for wheezing or shortness of breath.  1 Inhaler  prn  . oxyCODONE-acetaminophen (ROXICET) 5-325 MG per tablet Take 2 tablets by mouth every 4 (four) hours as needed for severe pain.  15 tablet  0  . Prenatal Vit-Fe Fumarate-FA  (PRENATAL MULTIVITAMIN) TABS tablet Take 1 tablet by mouth daily at 12 noon.        Review of Systems  Constitutional: Negative for fever and malaise/fatigue.  Gastrointestinal: Negative for nausea, vomiting and abdominal pain.  Neurological: Negative for dizziness and headaches.   Physical Exam   Blood pressure 123/62, pulse 61, temperature 98.3 F (36.8 C), resp. rate 20, last menstrual period 11/29/2012, SpO2 100.00%.  Physical Exam  Constitutional: She is oriented to person, place, and time. She appears well-developed and well-nourished. No distress.  HENT:  Head: Normocephalic.  Cardiovascular: Normal rate.   Respiratory: Effort normal.  GI: There is no tenderness.  Musculoskeletal: Normal range of motion.  Neurological: She is alert and oriented to person, place, and time.  Skin: Skin is warm and dry.  Psychiatric: She has a normal mood and affect.    MAU Course  Procedures  MDM  US Ob Transvaginal  01/24/2013   CLINICAL DATA:  Pregnancy with inconclusive viability. Vaginal bleeding.  EXAM: TRANSVAGINAL OB ULTRASOUND  TECHNIQUE: Transvaginal ultrasound was performed for complete evaluation of the gestation as well as the maternal uterus, adnexal regions, and pelvic cul-de-sac.  COMPARISON:  01/15/2013  FINDINGS: Previously seen small and  irregular intrauterine gestational sac is no longer visualized, consistent with completed SAB.  The endometrial cavity is divided into 2 portions by a myometrial septum, and the fundal contour of the uterus shows no definite indentation on 3D volume imaging. This is most likely due to a septate uterus. No fibroids identified.  Both ovaries are normal in appearance. No evidence of adnexal mass or free fluid.    IMPRESSION: Previously noted intrauterine gestational sac no longer visualized, consistent with completed SAB.  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 Recommend nonemergent pelvic MRI without contrast for further  evaluation.     Electronically Signed   By: Earle Gell M.D.   On: 01/24/2013 11:46    Assessment and Plan  A:  COmpleted spontaneous abortion   P:  Discharge home       Reviewed results of ultrasound       Will schedule followup in clinic  [facility] 01/24/2013, 1:29 PM

## 2013-01-25 NOTE — Discharge Instructions (Signed)
Miscarriage  A miscarriage is the loss of an unborn baby (fetus) before the 20th week of pregnancy. The cause is often unknown.   HOME CARE  · You may need to stay in bed (bed rest), or you may be able to do light activity. Go about activity as told by your doctor.  · Have help at home.  · Write down how many pads you use each day. Write down how soaked they are.  · Do not use tampons. Do not wash out your vagina (douche) or have sex (intercourse) until your doctor approves.  · Only take medicine as told by your doctor.  · Do not take aspirin.  · Keep all doctor visits as told.  · If you or your partner have problems with grieving, talk to your doctor. You can also try counseling. Give yourself time to grieve before trying to get pregnant again.  GET HELP RIGHT AWAY IF:  · You have bad cramps or pain in your back or belly (abdomen).  · You have a fever.  · You pass large clumps of blood (clots) from your vagina that are walnut-sized or larger. Save the clumps for your doctor to see.  · You pass large amounts of tissue from your vagina. Save the tissue for your doctor to see.  · You have more bleeding.  · You have thick, bad-smelling fluid (discharge) coming from the vagina.  · You get lightheaded, weak, or you pass out (faint).  · You have chills.  MAKE SURE YOU:  · Understand these instructions.  · Will watch your condition.  · Will get help right away if you are not doing well or get worse.  Document Released: 03/31/2011 Document Reviewed: 03/31/2011  [employer]® Patient Information ©2014 [employer].

## 2013-01-27 ENCOUNTER — Encounter: Payer: Medicaid Other | Admitting: Obstetrics & Gynecology

## 2013-01-28 ENCOUNTER — Ambulatory Visit: Payer: Medicaid Other | Admitting: Internal Medicine

## 2013-01-31 ENCOUNTER — Encounter: Payer: Medicaid Other | Admitting: Obstetrics & Gynecology

## 2013-02-01 ENCOUNTER — Encounter: Payer: Self-pay | Admitting: Obstetrics & Gynecology

## 2013-02-10 ENCOUNTER — Encounter: Payer: Medicaid Other | Admitting: Obstetrics & Gynecology

## 2013-02-14 ENCOUNTER — Telehealth: Payer: Self-pay | Admitting: Internal Medicine

## 2013-02-14 NOTE — Telephone Encounter (Signed)
She had been getting Vyvanse from Dr Etter Sjogren

## 2013-02-14 NOTE — Telephone Encounter (Signed)
Last OV 09/23/12 No pending OV Last fill 12/23/12 #30  CY - please advise on refill. Thanks.

## 2013-02-14 NOTE — Telephone Encounter (Signed)
I called and spoke with pt. Advised her of the recs. She no longer see's Dr. Etter Sjogren. Advised her to call her PCP. Nothing further needed

## 2013-02-21 ENCOUNTER — Ambulatory Visit: Payer: Medicaid Other | Admitting: Internal Medicine

## 2013-02-28 ENCOUNTER — Encounter: Payer: Medicaid Other | Admitting: Obstetrics & Gynecology

## 2013-03-01 ENCOUNTER — Telehealth: Payer: Self-pay | Admitting: Internal Medicine

## 2013-03-01 NOTE — Telephone Encounter (Signed)
I called and spoke with pt. Made pt aware no sooner appts. Nothing further needed

## 2013-03-14 ENCOUNTER — Ambulatory Visit (INDEPENDENT_AMBULATORY_CARE_PROVIDER_SITE_OTHER): Payer: Medicaid Other | Admitting: Internal Medicine

## 2013-03-14 ENCOUNTER — Other Ambulatory Visit: Payer: Medicaid Other

## 2013-03-14 ENCOUNTER — Encounter: Payer: Self-pay | Admitting: Internal Medicine

## 2013-03-14 VITALS — BP 118/72 | HR 80 | Ht 68.0 in | Wt 209.6 lb

## 2013-03-14 DIAGNOSIS — G47419 Narcolepsy without cataplexy: Secondary | ICD-10-CM

## 2013-03-14 MED ORDER — LISDEXAMFETAMINE DIMESYLATE 30 MG PO CAPS
ORAL_CAPSULE | ORAL | Status: DC
Start: 2013-03-14 — End: 2013-06-16

## 2013-03-14 NOTE — Progress Notes (Signed)
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 She dropped off of Pristiq as unhelpful. Adderall and Nuvigil had not seemed to help and were stopped over a year ago. Daytime sleepiness has been especially worse in the last 3-4 months.she thinks she sleeps well at night. She describes bedtime 10:30 or 11 PM with variable sleep latency. She gets up about 745 to get her son to school, but then may go back to bed for several hours. Did not make her feel better but "no choice". Caffeine has little effect. She does not think she is snoring and she denies sleepwalking or limb movement disturbance. Son has been a cause of stress, ill with hepatoblastoma. Asthma has been pretty well controlled but she wants to keep her rescue inhaler.  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 Tried Dexedrine 10 mg capsules up to 1, 3 times a day. 4 per day caused headache. Still felt "tired".. She also remains on Vyvanse and Welbutrin. We discussed interaction of these meds and her symptom of tired versus depression versus weak.she remains under a lot of family stress. Says she is frustrated by feeling tired. She goes home and goes to bed, feeling she has not given enough attention to her son who has special needs. Thyroid has been checked again. Asthma has not been a recent problem.  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 Daytime sleepiness remains oppressive despite  medication. Dexedrine has not helped. Vyvanse 70 mg helps some if taken with Wellbutrin 300 mg in the morning. She indicates her primary physician now is Dr. Lorenza Burton. NPSG-05/11/12- AHI 0.8 per hour, 390 minutes sleep, 15% REM, Epworth sleepiness score 20/24 MSLT 05/12/12- Mean Sleep Latency 1.4 minutes, SOREM 2/5 naps These scores are strongly consistent with narcolepsy.  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 Stimulants did not help-Adderall, rectal and, Nuvigil, Dexedrine She is applying for disability which I think is appropriate. Thyroid function was rechecked. She denies pregnancy or liver disease.  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  Also, tired and fatigued through out the day Does best if she takes Vyvanse with an antidepressant around noon. Sleepy by 6 PM. Having difficulty with disability application, asks a letter.  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 Pt sleeping till 11am and doesnt feel like she wants to get up in the morning. Feels better with afternoon naps. Mother is here for this visit and tells [place] Aunt had narcolepsy. Daytime sleepiness worse. Asks about increasing dose of Vyanse. Seeing a psychiatrist Dr Jonni Sanger Prozac. Patient thinks Wellbutrin worked better with her Vyanse. Now on Vyanse 70  mg qAM. Has lawyer appealing denial of disability.   ROS-see HPI Constitutional:   No-   weight loss, night sweats, fevers, chills,+ fatigue, lassitude. HEENT:   No-  headaches, difficulty swallowing, tooth/dental problems, sore throat,       No-  sneezing, itching, ear ache, nasal congestion,  post nasal drip,  CV:  No-   chest pain, orthopnea, PND, swelling in lower extremities, anasarca, dizziness, palpitations Resp: No-   shortness of breath with exertion or at rest.              No-   productive cough,  No non-productive cough,  No- coughing up of blood.              No-   change in color of mucus.  No- wheezing.   Skin: No-   rash or lesions. GI:  No-   heartburn, indigestion, abdominal pain, nausea, vomiting,  GU:  MS:  No-   joint pain or swelling.   Neuro-     nothing unusual Psych:  No- change in mood or affect. + depression or anxiety.  No memory loss.  OBJ- Physical Exam General- Alert, Oriented, Affect-appropriate/ discouraged, Distress- none physical. overweight. Skin- rash-none, lesions- none, excoriation- none.  Lymphadenopathy- none Head- atraumatic            Eyes- Gross vision intact, PERRLA, conjunctivae and secretions clear            Ears- Hearing, canals-normal            Nose- Clear, no-Septal dev, mucus, polyps, erosion, perforation             Throat- Mallampati II , mucosa clear , drainage- none, tonsils- atrophic Neck- flexible , trachea midline, no stridor , thyroid nl, carotid no bruit Chest - symmetrical excursion , unlabored           Heart/CV- RRR , no murmur , no gallop  , no rub, nl s1 s2                           - JVD- none , edema- none, stasis changes- none, varices- none           Lung- clear to P&A, wheeze- none, cough- none , dullness-none, rub- none           Chest wall-  Abd-  Br/ Gen/ Rectal- Not done, not indicated Extrem- cyanosis- none, clubbing, none, atrophy- none, strength- nl Neuro- grossly intact to observation. No tremor

## 2013-03-14 NOTE — Patient Instructions (Signed)
Ok to continue Vyvanse 70 mg in the early morning on waking  Script to try adding Vyvanse 30 mg in early afternoon, before 3:00 so it doesn't keep you up at night.  It is important to take naps when you can every day  Order- referral to [facility] for dx narcolepsy  Order - lab- Narcolepsy panel  Dx narcolepsy

## 2013-03-21 ENCOUNTER — Telehealth: Payer: Self-pay | Admitting: Internal Medicine

## 2013-03-21 NOTE — Telephone Encounter (Signed)
Results not back- these are slow.

## 2013-03-21 NOTE — Telephone Encounter (Signed)
Spoke with the pt  She is calling for lab results  I do not see them in the system Please advise thanks!

## 2013-03-21 NOTE — Telephone Encounter (Signed)
lmtcb x1 

## 2013-03-22 NOTE — Telephone Encounter (Signed)
LMTCBx2. Yacine Garriga, CMA  

## 2013-03-23 NOTE — Telephone Encounter (Signed)
LMTCBx3. Jennifer Castillo, CMA  

## 2013-03-23 NOTE — Telephone Encounter (Signed)
Returning a call

## 2013-03-23 NOTE — Telephone Encounter (Signed)
Spoke with pt and informed that per Dr Annamaria Boots - results may take some time to come back.

## 2013-04-06 ENCOUNTER — Telehealth: Payer: Self-pay | Admitting: Internal Medicine

## 2013-04-06 NOTE — Telephone Encounter (Signed)
Patient Instructions     Ok to continue Vyvanse 70 mg in the early morning on waking  Script to try adding Vyvanse 30 mg in early afternoon, before 3:00 so it doesn't keep you up at night.  It is important to take naps when you can every day  Order- referral to [facility] for dx narcolepsy  Order - lab- Narcolepsy panel Dx narcolepsy     Pt aware that referral was for sleep center at [facility] for narcolepsy. No further questions or concerns.

## 2013-04-07 NOTE — Assessment & Plan Note (Addendum)
Narcolepsy without cataplexy. Learning that a family member had same diagnosis adds some strength to our opinion. Plan-when able, reinforce good sleep hygiene, medication talk. Try Vyanse 70 mg earlier AM, 30 mg PM - early or mid afternoon if needed.              Lab for narcolepsy panel markers. Offered referral to [facility] for another opinion.

## 2013-05-04 ENCOUNTER — Institutional Professional Consult (permissible substitution): Payer: Medicaid Other | Admitting: Pulmonary Disease

## 2013-06-16 ENCOUNTER — Encounter: Payer: Self-pay | Admitting: Internal Medicine

## 2013-06-16 ENCOUNTER — Ambulatory Visit (INDEPENDENT_AMBULATORY_CARE_PROVIDER_SITE_OTHER): Payer: Medicaid Other | Admitting: Internal Medicine

## 2013-06-16 VITALS — BP 98/60 | HR 83 | Ht 68.0 in | Wt 199.0 lb

## 2013-06-16 DIAGNOSIS — F3289 Other specified depressive episodes: Secondary | ICD-10-CM

## 2013-06-16 DIAGNOSIS — F329 Major depressive disorder, single episode, unspecified: Secondary | ICD-10-CM

## 2013-06-16 DIAGNOSIS — G47419 Narcolepsy without cataplexy: Secondary | ICD-10-CM

## 2013-06-16 MED ORDER — LISDEXAMFETAMINE DIMESYLATE 30 MG PO CAPS: ORAL_CAPSULE | ORAL | Status: DC

## 2013-06-16 NOTE — Patient Instructions (Signed)
No change to suggest until you are seen again by the [facility] doctor  Please call as needed  Script for Vyvanse 30mg 

## 2013-06-16 NOTE — Progress Notes (Signed)
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 She dropped off of Pristiq as unhelpful. Adderall and Nuvigil had not seemed to help and were stopped over a year ago. Daytime sleepiness has been especially worse in the last 3-4 months.she thinks she sleeps well at night. She describes bedtime 10:30 or 11 PM with variable sleep latency. She gets up about 745 to get her son to school, but then may go back to bed for several hours. Did not make her feel better but "no choice". Caffeine has little effect. She does not think she is snoring and she denies sleepwalking or limb movement disturbance. Son has been a cause of stress, ill with hepatoblastoma. Asthma has been pretty well controlled but she wants to keep her rescue inhaler.  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 Tried Dexedrine 10 mg capsules up to 1, 3 times a day. 4 per day caused headache. Still felt "tired".. She also remains on Vyvanse and Welbutrin. We discussed interaction of these meds and her symptom of tired versus depression versus weak.she remains under a lot of family stress. Says she is frustrated by feeling tired. She goes home and goes to bed, feeling she has not given enough attention to her son who has special needs. Thyroid has been checked again. Asthma has not been a recent problem.  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 Daytime sleepiness remains oppressive despite  medication. Dexedrine has not helped. Vyvanse 70 mg helps some if taken with Wellbutrin 300 mg in the morning. She indicates her primary physician now is Dr. Lorenza Burton. NPSG-05/11/12- AHI 0.8 per hour, 390 minutes sleep, 15% REM, Epworth sleepiness score 20/24 MSLT 05/12/12- Mean Sleep Latency 1.4 minutes, SOREM 2/5 naps These scores are strongly consistent with narcolepsy.  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 Stimulants did not help-Adderall, rectal and, Nuvigil, Dexedrine She is applying for disability which I think is appropriate. Thyroid function was rechecked. She denies pregnancy or liver disease.  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  Also, tired and fatigued through out the day Does best if she takes Vyvanse with an antidepressant around noon. Sleepy by 6 PM. Having difficulty with disability application, asks a letter.  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 Pt sleeping till 11am and doesnt feel like she wants to get up in the morning. Feels better with afternoon naps. Mother is here for this visit and tells [place] Aunt had narcolepsy. Daytime sleepiness worse. Asks about increasing dose of Vyanse. Seeing a psychiatrist Dr Jonni Sanger Prozac. Patient thinks Wellbutrin worked better with her Vyanse. Now on Vyanse 70  mg qAM. Has lawyer appealing denial of disability.   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 Pt states she is sleeping alright during the night.  No longer pregnant-lost  pregnancy Taking Vyvanse 70 mg AM, 30 mg PM, helped for 1 week only, but continues that dose.  Still has f/u appointment with Sleep program at [facility]. C/O rash R axilla "recurrent shingles" HLA-DQB1- 03/14/13- negative for genetic marker for narcolepsy  ROS-see HPI Constitutional:   No-   weight loss, night sweats, fevers, chills,+ fatigue, lassitude. HEENT:   No-  headaches, difficulty swallowing, tooth/dental problems, sore throat,       No-  sneezing, itching, ear ache, nasal congestion, post nasal drip,  CV:  No-   chest pain, orthopnea, PND, swelling in lower extremities, anasarca, dizziness, palpitations Resp: No-   shortness of breath with exertion or at rest.              No-   productive cough,  No non-productive cough,  No- coughing up of blood.              No-   change in color of mucus.  No- wheezing.   Skin: No-   rash or lesions. GI:  No-   heartburn, indigestion, abdominal pain, nausea, vomiting,  GU:  MS:  No-   joint pain or swelling.   Neuro-     nothing unusual Psych:  No- change in mood or affect. + depression +anxiety.  No memory loss.  OBJ- Physical Exam  General- Alert, Oriented, Affect-appropriate, Distress- none physical. overweight. Skin- rash-none, lesions- none, excoriation- none.  Lymphadenopathy- none Head- atraumatic            Eyes- Gross vision intact, PERRLA, conjunctivae and secretions clear            Ears- Hearing, canals-normal            Nose- Clear, no-Septal dev, mucus, polyps, erosion, perforation             Throat- Mallampati II , mucosa clear , drainage- none, tonsils- atrophic Neck- flexible , trachea midline, no stridor , thyroid nl, carotid no bruit Chest - symmetrical excursion , unlabored           Heart/CV- RRR , no murmur , no gallop  , no rub, nl s1 s2                           - JVD- none , edema- none, stasis changes- none, varices- none           Lung- clear to P&A, wheeze- none, cough- none , dullness-none, rub- none            Chest wall-  Abd-  Br/ Gen/ Rectal- Not done, not indicated Extrem- cyanosis- none, clubbing, none, atrophy- none, strength- nl Neuro- grossly intact to observation. No tremor

## 2013-08-03 NOTE — Assessment & Plan Note (Signed)
Significant potential for depression.  Plan- PCP to direct for management

## 2013-08-03 NOTE — Assessment & Plan Note (Signed)
[facility] may conclude her chronic fatigue is more consistent with depression. Plan- F/u with [facility]. They will decide what to do about Vyvanse.

## 2013-08-18 ENCOUNTER — Encounter: Payer: Self-pay | Admitting: Internal Medicine

## 2013-10-17 ENCOUNTER — Ambulatory Visit: Payer: Medicaid Other | Admitting: Internal Medicine

## 2013-10-19 ENCOUNTER — Telehealth: Payer: Self-pay | Admitting: Internal Medicine

## 2013-10-19 NOTE — Telephone Encounter (Signed)
LMTCB with female that answered the phone-patient not at home at the time of my call.

## 2013-10-19 NOTE — Telephone Encounter (Signed)
Pt returned call- [phone]

## 2013-10-19 NOTE — Telephone Encounter (Signed)
Called spoke with patient who reported that it was discussed to increase her Vyvanse to 30mg  + 70mg .  Pt is wondering if her Vyvanse can just be increased to 70mg  instead.  She is aware CY is not in the office this afternoon and is okay with call back tomorrow.  If possible, she would like rx sent to [facility] on [address] in [facility]  Dr Annamaria Boots please advise, thank you. **pt did see sleep at [facility] and the ov note has been printed for your review.  Unable to discern if the sleep has yet to be done.

## 2013-10-20 NOTE — Telephone Encounter (Signed)
Does she have follow up appointment at [facility]? The plan at our last meeting in May was that she would go to [facility] and they would decide what to do with Vyvanse.

## 2013-10-20 NOTE — Telephone Encounter (Signed)
Called and spoke to pt. Pt stated she has a f/u appt with [facility] at the end of October. Pt requested I let CY know that the pt is now living with parents and does not have current transportation d/t recent separation of her husband.   Will forward to CY.

## 2013-10-21 NOTE — Telephone Encounter (Signed)
Noted  

## 2013-10-25 ENCOUNTER — Telehealth: Payer: Self-pay | Admitting: Internal Medicine

## 2013-10-25 NOTE — Telephone Encounter (Signed)
lmomtcb x1 

## 2013-10-26 NOTE — Telephone Encounter (Signed)
Called and spoke to pt. Pt requesting vyvanse refill. Per phone message on 10/1:  Deneise Lever, MD at 10/20/2013 9:02 AM     Status: Signed        Does she have follow up appointment at [facility]? The plan at our last meeting in May was that she would go to [facility] and they would decide what to do with Vyvanse.    Pt stated she is unsure of when her f/u is with [facility]. Pt stated she will check when her appt is with [facility] and if it is later than she originally thought then she may call back to see if CY can refill the vyvanse.   Will sign off as nothing at this time is needed.

## 2013-11-21 ENCOUNTER — Encounter: Payer: Self-pay | Admitting: Internal Medicine

## 2013-12-14 ENCOUNTER — Telehealth: Payer: Self-pay | Admitting: Internal Medicine

## 2013-12-14 NOTE — Telephone Encounter (Signed)
Spoke with pt, states she would like an appt with CY if possible to discuss narcolepsy meds.  Pt has been taking vivance 70mg  which helps sometimes, but saw a commercial on tv for Desoxyn which is supposed to help with narcolepsy and is interested in trying it if CY feels like it would be a good fit.  Dr Annamaria Boots please advise if pt needs appt or if you are ok with a med change.  Thank you.   Allergies  Allergen Reactions  . Pristiq [Desvenlafaxine]     headaches   Current Outpatient Prescriptions on File Prior to Visit  Medication Sig Dispense Refill  . albuterol (PROVENTIL HFA) 108 (90 BASE) MCG/ACT inhaler Inhale 2 puffs into the lungs every 4 (four) hours as needed for wheezing or shortness of breath. 1 Inhaler prn  . lisdexamfetamine (VYVANSE) 30 MG capsule 1 daily early afternoon as needed 30 capsule 0  . OVER THE COUNTER MEDICATION Take 1 capsule by mouth daily. Women's Choice    . OVER THE COUNTER MEDICATION Take 1 tablet by mouth daily. Prenatal vit.     No current facility-administered medications on file prior to visit.

## 2013-12-14 NOTE — Telephone Encounter (Signed)
Pt will call back next week to schedule an apppt- will not be in town this Friday 11/27 for appt.  Nothing further needed.

## 2013-12-14 NOTE — Telephone Encounter (Signed)
Pt can come in Friday 12-16-13 at 11:15am. Thanks.

## 2013-12-22 ENCOUNTER — Encounter: Payer: Self-pay | Admitting: Internal Medicine

## 2013-12-22 ENCOUNTER — Ambulatory Visit (INDEPENDENT_AMBULATORY_CARE_PROVIDER_SITE_OTHER): Payer: Medicaid Other | Admitting: Internal Medicine

## 2013-12-22 VITALS — BP 102/70 | HR 86 | Ht 68.0 in | Wt 205.0 lb

## 2013-12-22 DIAGNOSIS — G47419 Narcolepsy without cataplexy: Secondary | ICD-10-CM

## 2013-12-22 MED ORDER — METHAMPHETAMINE HCL 5 MG PO TABS: ORAL_TABLET | ORAL | Status: DC

## 2013-12-22 NOTE — Patient Instructions (Signed)
Script to try Desoxyn (methamphetamine)  Instead of Vyvanse  We will find out status of the Modafanil prescribed at [facility].   Please call as needed

## 2013-12-22 NOTE — Progress Notes (Signed)
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 She dropped off of Pristiq as unhelpful. Adderall and Nuvigil had not seemed to help and were stopped over a year ago. Daytime sleepiness has been especially worse in the last 3-4 months.she thinks she sleeps well at night. She describes bedtime 10:30 or 11 PM with variable sleep latency. She gets up about 745 to get her son to school, but then may go back to bed for several hours. Did not make her feel better but "no choice". Caffeine has little effect. She does not think she is snoring and she denies sleepwalking or limb movement disturbance. Son has been a cause of stress, ill with hepatoblastoma. Asthma has been pretty well controlled but she wants to keep her rescue inhaler.  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 Tried Dexedrine 10 mg capsules up to 1, 3 times a day. 4 per day caused headache. Still felt "tired".. She also remains on Vyvanse and Welbutrin. We discussed interaction of these meds and her symptom of tired versus depression versus weak.she remains under a lot of family stress. Says she is frustrated by feeling tired. She goes home and goes to bed, feeling she has not given enough attention to her son who has special needs. Thyroid has been checked again. Asthma has not been a recent problem.  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 Daytime sleepiness remains oppressive despite  medication. Dexedrine has not helped. Vyvanse 70 mg helps some if taken with Wellbutrin 300 mg in the morning. She indicates her primary physician now is Dr. Lorenza Burton. NPSG-05/11/12- AHI 0.8 per hour, 390 minutes sleep, 15% REM, Epworth sleepiness score 20/24 MSLT 05/12/12- Mean Sleep Latency 1.4 minutes, SOREM 2/5 naps These scores are strongly consistent with narcolepsy.  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 Stimulants did not help-Adderall, rectal and, Nuvigil, Dexedrine She is applying for disability which I think is appropriate. Thyroid function was rechecked. She denies pregnancy or liver disease.  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  Also, tired and fatigued through out the day Does best if she takes Vyvanse with an antidepressant around noon. Sleepy by 6 PM. Having difficulty with disability application, asks a letter.  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 Pt sleeping till 11am and doesnt feel like she wants to get up in the morning. Feels better with afternoon naps. Mother is here for this visit and tells [place] Aunt had narcolepsy. Daytime sleepiness worse. Asks about increasing dose of Vyanse. Seeing a psychiatrist Dr Jonni Sanger Prozac. Patient thinks Wellbutrin worked better with her Vyanse. Now on Vyanse 70  mg qAM. Has lawyer appealing denial of disability.   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 Pt states she is sleeping alright during the night.  No longer pregnant-lost  pregnancy Taking Vyvanse 70 mg AM, 30 mg PM, helped for 1 week only, but continues that dose.  Still has f/u appointment with Sleep program at [facility]. 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 Not able to go to [facility] any longer and would like CY to follow her for this and treat. [facility] gave modafanil 200, Vyvanse 70, ordered split PSG on 10/19/13 Modafanil was approved but she says she has not been taking it. Sounds as if [facility] wanted to try Xyrem- she was afraid of it. They wanted her to have another sleep study she can't afford.  She wants to try Desoxyn (methamphetamine) she learned about from on-line narcolepsy site.   ROS-see HPI Constitutional:   No-   weight loss, night sweats, fevers, chills,+ fatigue, lassitude. HEENT:   No-  headaches, difficulty swallowing, tooth/dental problems, sore throat,       No-  sneezing, itching, ear ache, nasal congestion, post nasal drip,  CV:  No-   chest pain, orthopnea, PND, swelling in lower extremities, anasarca, dizziness, palpitations Resp: No-   shortness of breath with exertion or at rest.              No-   productive cough,  No non-productive cough,  No- coughing up of blood.              No-   change in color of mucus.  No- wheezing.   Skin: No-   rash or lesions. GI:  No-   heartburn, indigestion, abdominal pain, nausea, vomiting,  GU:  MS:  No-   joint pain or swelling.   Neuro-     nothing unusual Psych:  No- change in mood or affect. + depression +anxiety.  No memory loss.  OBJ- Physical Exam  General- Alert, Oriented, Affect-appropriate, Distress- none physical. overweight. Skin- rash-none, lesions- none, excoriation- none.  Lymphadenopathy- none Head- atraumatic            Eyes- Gross vision  intact, PERRLA, conjunctivae and secretions clear            Ears- Hearing, canals-normal            Nose- Clear, no-Septal dev, mucus, polyps, erosion, perforation             Throat- Mallampati II , mucosa clear , drainage- none, tonsils- atrophic Neck- flexible , trachea midline, no stridor , thyroid nl, carotid no bruit Chest - symmetrical excursion , unlabored           Heart/CV- RRR , no murmur , no gallop  , no rub, nl s1 s2                           - JVD- none , edema- none, stasis changes- none, varices- none           Lung- clear to P&A, wheeze- none, cough- none , dullness-none, rub- none  Chest wall-  Abd-  Br/ Gen/ Rectal- Not done, not indicated Extrem- cyanosis- none, clubbing, none, atrophy- none, strength- nl Neuro- grossly intact to observation. No tremor

## 2013-12-24 NOTE — Assessment & Plan Note (Signed)
Always watching for overlap between depression (partly related to finances and partly to difficulty taking care of her Mary [name] child with cancer), and medical sleep disorder. She had gotten prior authorization to use modafanil by [facility]. Unclear why she didn't start it. She is very interested in trying  Desoxyn because that's what's being suggested on  narcolepsy site she  has been following.. We discussed it and will let her try, but may come back to Modafanil, which I think she had tried years ago. Plan- Desoxyn trial

## 2014-02-22 ENCOUNTER — Ambulatory Visit: Payer: Medicaid Other | Admitting: Internal Medicine

## 2014-03-03 ENCOUNTER — Telehealth: Payer: Self-pay | Admitting: Pulmonary Disease

## 2014-03-03 NOTE — Telephone Encounter (Signed)
[facility] pt. Called pt and LMTCB x1

## 2014-03-06 NOTE — Telephone Encounter (Signed)
Called and spoke to pt. Pt is requesting a letter describing the extent of the pt's narcolepsy. Pt stated she needs proof for her family showing that the pt has daily limitations d/t her narcolepsy. Pt sad and upset when talking on the phone. Pt's new address: [address] [place].  CY please advise if ok to write and send this letter to pt for personal reasons.

## 2014-03-06 NOTE — Telephone Encounter (Signed)
Spoke with patient-she will need to contact me back next week to schedule OV with CY as she is taking care of her son and getting his health issues dealt with. Pt will call back and ask for me. Nothing more needed at this time.

## 2014-03-06 NOTE — Telephone Encounter (Signed)
Probably best we get her in to discuss her request - routine ov when available Note computer lists Dr Gwenette Greet as her provider, but it should be CD[facility]

## 2014-07-20 ENCOUNTER — Telehealth: Payer: Self-pay | Admitting: Internal Medicine

## 2014-07-20 NOTE — Telephone Encounter (Signed)
Spoke with pt, states that CY had filled out forms for her disability and just wanted to make CY aware that her case was going forward.   FYI for CY.  Nothing further needed.

## 2014-07-20 NOTE — Telephone Encounter (Signed)
Noted  

## 2014-09-09 DIAGNOSIS — O1413 Severe pre-eclampsia, third trimester: Secondary | ICD-10-CM | POA: Diagnosis present

## 2014-09-09 DIAGNOSIS — Q513 Bicornate uterus: Secondary | ICD-10-CM | POA: Diagnosis not present

## 2014-09-09 DIAGNOSIS — O3421 Maternal care for scar from previous cesarean delivery: Secondary | ICD-10-CM | POA: Diagnosis present

## 2014-09-09 DIAGNOSIS — O139 Gestational [pregnancy-induced] hypertension without significant proteinuria, unspecified trimester: Secondary | ICD-10-CM | POA: Diagnosis present

## 2014-09-09 DIAGNOSIS — Z302 Encounter for sterilization: Secondary | ICD-10-CM | POA: Diagnosis not present

## 2014-09-09 DIAGNOSIS — O09523 Supervision of elderly multigravida, third trimester: Secondary | ICD-10-CM | POA: Diagnosis not present

## 2014-09-09 DIAGNOSIS — R1011 Right upper quadrant pain: Secondary | ICD-10-CM | POA: Diagnosis not present

## 2014-09-09 DIAGNOSIS — O34593 Maternal care for other abnormalities of gravid uterus, third trimester: Secondary | ICD-10-CM | POA: Diagnosis present

## 2014-09-09 DIAGNOSIS — Z3A37 37 weeks gestation of pregnancy: Secondary | ICD-10-CM | POA: Diagnosis present

## 2014-11-14 ENCOUNTER — Telehealth: Payer: Self-pay | Admitting: Internal Medicine

## 2014-11-14 NOTE — Telephone Encounter (Signed)
Per 12/22/13 OV: Patient Instructions       Script to try Desoxyn (methamphetamine)  Instead of Vyvanse We will find out status of the Modafanil prescribed at [facility].  Please call as needed  ---  lmomtcb x1 for pt These medications are not on pt med list

## 2014-11-15 ENCOUNTER — Ambulatory Visit: Payer: Medicaid Other | Admitting: Internal Medicine

## 2014-11-15 MED ORDER — LISDEXAMFETAMINE DIMESYLATE 70 MG PO CAPS: 70.0000 mg | ORAL_CAPSULE | Freq: Every day | ORAL | Status: DC

## 2014-11-15 MED ORDER — METHYLPHENIDATE HCL 5 MG PO TABS: 5.0000 mg | ORAL_TABLET | Freq: Three times a day (TID) | ORAL | Status: DC

## 2014-11-15 NOTE — Telephone Encounter (Signed)
lmtcb x2 for pt. 

## 2014-11-15 NOTE — Telephone Encounter (Signed)
Spoke with the pt  She is requesting refill on wellbutrin and vyvance  Wellbutrin in not on med list, although she states that [facility] has "always given me this"- (according to records not since 2013) Also, last ov note states to take methamphetamine INSTEAD on vyvnace, but she has been taking both meds- vyvance am and methamphetamine at hs  Last ov 12/22/13 She had ov today but she had to cancel to take care of her son  She has ov pending 02/20/15 but states" I will call every day to check for cancellation, b/c I really need to be seen" Please advise thanks!

## 2014-11-15 NOTE — Telephone Encounter (Signed)
Patient returned call, please call after 11:10 am at [phone].

## 2014-11-15 NOTE — Telephone Encounter (Signed)
Pt is aware of CY's recommendations. She would like for her prescriptions to be mailed to her. These will be placed in the mail today. Nothing further was needed.

## 2014-11-15 NOTE — Addendum Note (Signed)
Addended by: Desmond Dike C on: 11/15/2014 02:03 PM   Modules accepted: Orders

## 2014-11-15 NOTE — Telephone Encounter (Signed)
We had not given Welbutrin in 3 years- not since 2013. Suggest we not refill this now.  Ok to refill both Vyvanse and methylphenidate

## 2014-12-22 ENCOUNTER — Telehealth: Payer: Self-pay | Admitting: Internal Medicine

## 2014-12-22 MED ORDER — LISDEXAMFETAMINE DIMESYLATE 70 MG PO CAPS: 70.0000 mg | ORAL_CAPSULE | Freq: Every day | ORAL | Status: DC

## 2014-12-22 NOTE — Telephone Encounter (Signed)
Okay to refill? 

## 2014-12-22 NOTE — Telephone Encounter (Signed)
LMTCB x1 for pt Need to confirm mailing address

## 2014-12-22 NOTE — Telephone Encounter (Signed)
Verified mailing address with patient.  Rx printed and placed on CY cart to sign.  ---------- Rx signed and placed in outgoing mail.  Nothing further needed.

## 2014-12-22 NOTE — Telephone Encounter (Signed)
Pt requesting vyvanse refill Last refilled 11/15/14 #30  X 0 refills Take 1 capsule (70 mg total) by mouth daily.  Please advise Dr. Annamaria Boots thanks

## 2015-01-09 ENCOUNTER — Encounter: Payer: Self-pay | Admitting: Internal Medicine

## 2015-01-09 ENCOUNTER — Ambulatory Visit (INDEPENDENT_AMBULATORY_CARE_PROVIDER_SITE_OTHER): Payer: Medicaid Other | Admitting: Internal Medicine

## 2015-01-09 DIAGNOSIS — G47419 Narcolepsy without cataplexy: Secondary | ICD-10-CM | POA: Diagnosis not present

## 2015-01-09 MED ORDER — LISDEXAMFETAMINE DIMESYLATE 70 MG PO CAPS: 70.0000 mg | ORAL_CAPSULE | Freq: Every day | ORAL | Status: DC

## 2015-01-09 MED ORDER — AMPHETAMINE-DEXTROAMPHET ER 30 MG PO CP24: 30.0000 mg | ORAL_CAPSULE | Freq: Every day | ORAL | Status: DC

## 2015-01-09 NOTE — Progress Notes (Signed)
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 She dropped off of Pristiq as unhelpful. Adderall and Nuvigil had not seemed to help and were stopped over a year ago. Daytime sleepiness has been especially worse in the last 3-4 months.she thinks she sleeps well at night. She describes bedtime 10:30 or 11 PM with variable sleep latency. She gets up about 745 to get her son to school, but then may go back to bed for several hours. Did not make her feel better but "no choice". Caffeine has little effect. She does not think she is snoring and she denies sleepwalking or limb movement disturbance. Son has been a cause of stress, ill with hepatoblastoma. Asthma has been pretty well controlled but she wants to keep her rescue inhaler.  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 Tried Dexedrine 10 mg capsules up to 1, 3 times a day. 4 per day caused headache. Still felt "tired".. She also remains on Vyvanse and Welbutrin. We discussed interaction of these meds and her symptom of tired versus depression versus weak.she remains under a lot of family stress. Says she is frustrated by feeling tired. She goes home and goes to bed, feeling she has not given enough attention to her son who has special needs. Thyroid has been checked again. Asthma has not been a recent problem.  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 Daytime sleepiness remains oppressive despite  medication. Dexedrine has not helped. Vyvanse 70 mg helps some if taken with Wellbutrin 300 mg in the morning. She indicates her primary physician now is Dr. Lorenza Burton. NPSG-05/11/12- AHI 0.8 per hour, 390 minutes sleep, 15% REM, Epworth sleepiness score 20/24 MSLT 05/12/12- Mean Sleep Latency 1.4 minutes, SOREM 2/5 naps These scores are strongly consistent with narcolepsy.  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 Stimulants did not help-Adderall, rectal and, Nuvigil, Dexedrine She is applying for disability which I think is appropriate. Thyroid function was rechecked. She denies pregnancy or liver disease.  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  Also, tired and fatigued through out the day Does best if she takes Vyvanse with an antidepressant around noon. Sleepy by 6 PM. Having difficulty with disability application, asks a letter.  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 Pt sleeping till 11am and doesnt feel like she wants to get up in the morning. Feels better with afternoon naps. Mother is here for this visit and tells [place] Aunt had narcolepsy. Daytime sleepiness worse. Asks about increasing dose of Vyanse. Seeing a psychiatrist Dr Jonni Sanger Prozac. Patient thinks Wellbutrin worked better with her Vyanse. Now on Vyanse 70  mg qAM. Has lawyer appealing denial of disability.   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 Pt states she is sleeping alright during the night.  No longer pregnant-lost  pregnancy Taking Vyvanse 70 mg AM, 30 mg PM, helped for 1 week only, but continues that dose.  Still has f/u appointment with Sleep program at [facility]. 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 Not able to go to [facility] any longer and would like CY to follow her for this and treat. [facility] gave modafanil 200, Vyvanse 70, ordered split PSG on 10/19/13 Modafanil was approved but she says she has not been taking it. Sounds as if [facility] wanted to try Xyrem- she was afraid of it. They wanted her to have another sleep study she can't afford.  She wants to try Desoxyn (methamphetamine) she learned about from on-line narcolepsy site.   01/09/2015-44 year old female never smoker followed for narcolepsy without cataplexy, history UPPP, asthma. Son has hepatoblastoma in remission FOLLOWS FOR: Pt states worsening narcolepsy since having a child. States she is tired all the time  [facility] had her on "dislexamfetamine" (Vyvanse?) 70 mg daily, ordered modafinil 200 and ordered sleep study at last ov 6/24//15 Avoid tolerance she asks about alternating between Adderall and are and Vyvanse, promising not to take both same day. Now has second baby with full-time partner, not nursing. Mother died of ALS Getting more regular sleep. Would like to lose weight. We discussed interaction between stimulant weight loss drugs and her prescribed stimulants. She denies palpitation.  ROS-see HPI Constitutional:   No-   weight loss, night sweats, fevers, chills,+ fatigue, lassitude. HEENT:   No-  headaches, difficulty swallowing, tooth/dental problems, sore throat,       No-  sneezing, itching, ear ache, nasal congestion, post nasal drip,  CV:  No-   chest pain, orthopnea, PND, swelling in lower  extremities, anasarca, dizziness, palpitations Resp: No-   shortness of breath with exertion or at rest.              No-   productive cough,  No non-productive cough,  No- coughing up of blood.              No-   change in color of mucus.  No- wheezing.   Skin: No-   rash or lesions. GI:  No-   heartburn, indigestion, abdominal pain, nausea, vomiting,  GU:  MS:  No-   joint pain or swelling.   Neuro-     nothing unusual Psych:  No- change in mood or affect. + depression +anxiety.  No memory loss.  OBJ- Physical Exam  General- Alert, Oriented, Affect-appropriate, Distress- none physical. + overweight. Skin- rash-none, lesions- none, excoriation- none.  Lymphadenopathy- none Head- atraumatic            Eyes- Gross vision intact, PERRLA, conjunctivae and secretions clear            Ears- Hearing, canals-normal            Nose- Clear, no-Septal dev, mucus, polyps, erosion, perforation             Throat- Mallampati II , mucosa clear , drainage-  none, tonsils- atrophic Neck- flexible , trachea midline, no stridor , thyroid nl, carotid no bruit Chest - symmetrical excursion , unlabored           Heart/CV- RRR , no murmur , no gallop  , no rub, nl s1 s2                           - JVD- none , edema- none, stasis changes- none, varices- none           Lung- clear to P&A, wheeze- none, cough- none , dullness-none, rub- none           Chest wall-  Abd-  Br/ Gen/ Rectal- Not done, not indicated Extrem- cyanosis- none, clubbing, none, atrophy- none, strength- nl Neuro- grossly intact to observation. No tremor

## 2015-01-09 NOTE — Patient Instructions (Addendum)
Script printed for Adderall 30 mg XR to alternate with Vyvanse. See if this helps   Please call as needed

## 2015-01-11 NOTE — Assessment & Plan Note (Signed)
New baby is now 26 or 53 months old. She has not adapted to a weight-loss lifestyle.

## 2015-01-11 NOTE — Assessment & Plan Note (Signed)
We discussed medication tolerance and appropriate restraint with controlled meds. Okay to try alternating Vyvanse with Adderall XRT every other day or in rhythm that works for her to avoid tolerance.

## 2015-01-31 ENCOUNTER — Telehealth: Payer: Self-pay | Admitting: Internal Medicine

## 2015-01-31 ENCOUNTER — Encounter: Payer: Self-pay | Admitting: Internal Medicine

## 2015-01-31 NOTE — Telephone Encounter (Signed)
Spoke with the pt  She states that she is needing letter stating that [facility] approves her for disability  She states that the letter should state that she is one of the cases that meds for narcolepsy do not work on  She wants the letter to be faxed to [phone] marked urgent  Please advise thanks!

## 2015-02-01 NOTE — Telephone Encounter (Signed)
done

## 2015-02-02 NOTE — Telephone Encounter (Signed)
Letter has been signed and faxed to [phone]. Pt is aware. Nothing further was needed.

## 2015-02-20 ENCOUNTER — Ambulatory Visit: Payer: Medicaid Other | Admitting: Internal Medicine

## 2015-03-05 ENCOUNTER — Telehealth: Payer: Self-pay | Admitting: Internal Medicine

## 2015-03-05 NOTE — Telephone Encounter (Signed)
Attempted to call pt. Line was answered then went dead. Will try back.

## 2015-03-06 MED ORDER — LISDEXAMFETAMINE DIMESYLATE 70 MG PO CAPS: 70.0000 mg | ORAL_CAPSULE | Freq: Every day | ORAL | Status: DC

## 2015-03-06 MED ORDER — LISDEXAMFETAMINE DIMESYLATE 40 MG PO CAPS: 40.0000 mg | ORAL_CAPSULE | ORAL | Status: DC

## 2015-03-06 NOTE — Telephone Encounter (Signed)
Ok Rx Vyvanse 70 mg, # 30, 1 daily    And   Vyvanse 40 mg, # 30, 1 daily  For Dx narcolepsy

## 2015-03-06 NOTE — Telephone Encounter (Signed)
Spoke with patient - states that she needs a refill on her Vyvanse. Pt is requesting to increase dose 70mg  in AM and 40mg  in afternoon - states that this was tried in the past.  Please advise Dr Annamaria Boots. Thanks.    Medication List       This list is accurate as of: 03/05/15 11:59 PM.  Always use your most recent med list.               albuterol 108 (90 Base) MCG/ACT inhaler  Commonly known as:  PROVENTIL HFA;VENTOLIN HFA  Inhale 2 puffs into the lungs every 4 (four) hours as needed.     amphetamine-dextroamphetamine 30 MG 24 hr capsule  Commonly known as:  ADDERALL XR  Take 1 capsule (30 mg total) by mouth daily.     CELEXA 20 MG tablet  Generic drug:  citalopram  Take 40 mg by mouth.     lisdexamfetamine 70 MG capsule  Commonly known as:  VYVANSE  Take 1 capsule (70 mg total) by mouth daily.     methylphenidate 5 MG tablet  Commonly known as:  RITALIN  Take 1-3 tablets (5-15 mg total) by mouth 3 (three) times daily.     multivitamin tablet  Take 1 tablet by mouth daily.     PRILOSEC 20 MG capsule  Generic drug:  omeprazole  Take 20 mg by mouth daily.     PROCARDIA XL 30 MG 24 hr tablet  Generic drug:  NIFEdipine  Take 30 mg by mouth daily.

## 2015-03-06 NOTE — Telephone Encounter (Signed)
Rxs have been printed and signed by CY. Pt is aware and would like these mailed to her. These have been placed in the outgoing mail.

## 2015-04-23 ENCOUNTER — Telehealth: Payer: Self-pay | Admitting: Internal Medicine

## 2015-04-23 MED ORDER — LISDEXAMFETAMINE DIMESYLATE 40 MG PO CAPS: 40.0000 mg | ORAL_CAPSULE | ORAL | Status: DC

## 2015-04-23 MED ORDER — LISDEXAMFETAMINE DIMESYLATE 70 MG PO CAPS: 70.0000 mg | ORAL_CAPSULE | Freq: Every day | ORAL | Status: DC

## 2015-04-23 NOTE — Telephone Encounter (Signed)
Rx printed and mailed to confirmed address. Patient aware. Nothing further needed.

## 2015-04-23 NOTE — Telephone Encounter (Signed)
Ok to refill both?? 

## 2015-04-23 NOTE — Telephone Encounter (Signed)
Pt requesting a refill of her Vyvanse 40 and 70 Last filled 03/06/15 Vyvanse 70 mg, # 30, 1 daily Vyvanse 40 mg, # 30, 1 daily Upcoming OV 07/10/15 Please advise CY. Thanks.     Medication List       This list is accurate as of: 04/23/15 10:59 AM.  Always use your most recent med list.               albuterol 108 (90 Base) MCG/ACT inhaler  Commonly known as:  PROVENTIL HFA;VENTOLIN HFA  Inhale 2 puffs into the lungs every 4 (four) hours as needed.     amphetamine-dextroamphetamine 30 MG 24 hr capsule  Commonly known as:  ADDERALL XR  Take 1 capsule (30 mg total) by mouth daily.     CELEXA 20 MG tablet  Generic drug:  citalopram  Take 40 mg by mouth.     methylphenidate 5 MG tablet  Commonly known as:  RITALIN  Take 1-3 tablets (5-15 mg total) by mouth 3 (three) times daily.     multivitamin tablet  Take 1 tablet by mouth daily.     PRILOSEC 20 MG capsule  Generic drug:  omeprazole  Take 20 mg by mouth daily.     PROCARDIA XL 30 MG 24 hr tablet  Generic drug:  NIFEdipine  Take 30 mg by mouth daily.     VYVANSE 70 MG capsule  Generic drug:  lisdexamfetamine  Take 70 mg by mouth daily.     lisdexamfetamine 70 MG capsule  Commonly known as:  VYVANSE  Take 1 capsule (70 mg total) by mouth daily.     lisdexamfetamine 40 MG capsule  Commonly known as:  VYVANSE  Take 1 capsule (40 mg total) by mouth every morning.

## 2015-05-17 ENCOUNTER — Other Ambulatory Visit: Payer: Self-pay | Admitting: Internal Medicine

## 2015-05-17 NOTE — Telephone Encounter (Signed)
Patient called returning our call - Pt can be reached at [phone]. - Thanks - prm

## 2015-05-17 NOTE — Telephone Encounter (Signed)
LM for patient x 1 

## 2015-05-21 MED ORDER — LISDEXAMFETAMINE DIMESYLATE 70 MG PO CAPS: 70.0000 mg | ORAL_CAPSULE | Freq: Every day | ORAL | Status: DC

## 2015-05-21 MED ORDER — LISDEXAMFETAMINE DIMESYLATE 40 MG PO CAPS: 40.0000 mg | ORAL_CAPSULE | ORAL | Status: DC

## 2015-05-21 NOTE — Telephone Encounter (Signed)
Rxs signed by Dr. Annamaria Boots and placed in mail. Pt aware.  Nothing further needed.

## 2015-05-21 NOTE — Telephone Encounter (Signed)
Ok to refill 

## 2015-05-21 NOTE — Telephone Encounter (Signed)
Rxs printed and placed on CY's cart for signature along with addressed envelope.

## 2015-05-21 NOTE — Telephone Encounter (Signed)
Spoke with pt, requesting Vyvanse refills to be sent to her home-verified address on file. Last ov: 01/09/2015 Next ov: 07/10/15 Vyvanse 40mg  last refilled 04/23/15 #30 with 0 refills- 1 tab po qd.  Vyvanse 70mg  last refilled 04/23/15 #30 with 0 refills- 1 tab po qd.  CY please advise on refill.  Thanks!

## 2015-06-25 ENCOUNTER — Telehealth: Payer: Self-pay | Admitting: Internal Medicine

## 2015-06-25 NOTE — Telephone Encounter (Signed)
Pt is needing a refill on Vyvanse. Last OV was on 01/09/15. Has pending OV on 07/10/15. Last refill was on 05/21/15.  CY - please advise on refill. Thanks.

## 2015-06-26 MED ORDER — LISDEXAMFETAMINE DIMESYLATE 70 MG PO CAPS: 70.0000 mg | ORAL_CAPSULE | Freq: Every day | ORAL | Status: DC

## 2015-06-26 MED ORDER — LISDEXAMFETAMINE DIMESYLATE 40 MG PO CAPS: 40.0000 mg | ORAL_CAPSULE | ORAL | Status: DC

## 2015-06-26 NOTE — Telephone Encounter (Signed)
Patient called back and states she needs both prescriptions mailed to her -prm

## 2015-06-26 NOTE — Telephone Encounter (Signed)
lmtcb x1 for pt. Does she need 70mg , 40mg  or both?

## 2015-06-26 NOTE — Telephone Encounter (Signed)
Both prescriptions have been placed in the mail today. Nothing further was needed.

## 2015-06-26 NOTE — Telephone Encounter (Signed)
Ok to refill 

## 2015-07-04 DIAGNOSIS — N898 Other specified noninflammatory disorders of vagina: Secondary | ICD-10-CM | POA: Diagnosis not present

## 2015-07-04 DIAGNOSIS — B9689 Other specified bacterial agents as the cause of diseases classified elsewhere: Secondary | ICD-10-CM | POA: Diagnosis not present

## 2015-07-04 DIAGNOSIS — N760 Acute vaginitis: Secondary | ICD-10-CM | POA: Diagnosis not present

## 2015-07-04 DIAGNOSIS — R309 Painful micturition, unspecified: Secondary | ICD-10-CM | POA: Diagnosis not present

## 2015-07-10 ENCOUNTER — Ambulatory Visit (INDEPENDENT_AMBULATORY_CARE_PROVIDER_SITE_OTHER): Payer: Medicare Other | Admitting: Internal Medicine

## 2015-07-10 ENCOUNTER — Encounter: Payer: Self-pay | Admitting: Internal Medicine

## 2015-07-10 VITALS — BP 124/60 | HR 73 | Ht 68.0 in | Wt 205.6 lb

## 2015-07-10 DIAGNOSIS — G47419 Narcolepsy without cataplexy: Secondary | ICD-10-CM | POA: Diagnosis not present

## 2015-07-10 NOTE — Patient Instructions (Signed)
Be sure to follow through with the endocrinologist-  That may make all the difference for you.  Ok to continue Vyvanse

## 2015-07-10 NOTE — Progress Notes (Signed)
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 She dropped off of Pristiq as unhelpful. Adderall and Nuvigil had not seemed to help and were stopped over a year ago. Daytime sleepiness has been especially worse in the last 3-4 months.she thinks she sleeps well at night. She describes bedtime 10:30 or 11 PM with variable sleep latency. She gets up about 745 to get her son to school, but then may go back to bed for several hours. Did not make her feel better but "no choice". Caffeine has little effect. She does not think she is snoring and she denies sleepwalking or limb movement disturbance. Son has been a cause of stress, ill with hepatoblastoma. Asthma has been pretty well controlled but she wants to keep her rescue inhaler.  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 Tried Dexedrine 10 mg capsules up to 1, 3 times a day. 4 per day caused headache. Still felt "tired".. She also remains on Vyvanse and Welbutrin. We discussed interaction of these meds and her symptom of tired versus depression versus weak.she remains under a lot of family stress. Says she is frustrated by feeling tired. She goes home and goes to bed, feeling she has not given enough attention to her son who has special needs. Thyroid has been checked again. Asthma has not been a recent problem.  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 Daytime sleepiness remains oppressive despite  medication. Dexedrine has not helped. Vyvanse 70 mg helps some if taken with Wellbutrin 300 mg in the morning. She indicates her primary physician now is Dr. Lorenza Burton. NPSG-05/11/12- AHI 0.8 per hour, 390 minutes sleep, 15% REM, Epworth sleepiness score 20/24 MSLT 05/12/12- Mean Sleep Latency 1.4 minutes, SOREM 2/5 naps These scores are strongly consistent with narcolepsy.  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 Stimulants did not help-Adderall, rectal and, Nuvigil, Dexedrine She is applying for disability which I think is appropriate. Thyroid function was rechecked. She denies pregnancy or liver disease.  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  Also, tired and fatigued through out the day Does best if she takes Vyvanse with an antidepressant around noon. Sleepy by 6 PM. Having difficulty with disability application, asks a letter.  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 Pt sleeping till 11am and doesnt feel like she wants to get up in the morning. Feels better with afternoon naps. Mother is here for this visit and tells [place] Aunt had narcolepsy. Daytime sleepiness worse. Asks about increasing dose of Vyanse. Seeing a psychiatrist Dr Jonni Sanger Prozac. Patient thinks Wellbutrin worked better with her Vyanse. Now on Vyanse 70  mg qAM. Has lawyer appealing denial of disability.   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 Pt states she is sleeping alright during the night.  No longer pregnant-lost  pregnancy Taking Vyvanse 70 mg AM, 30 mg PM, helped for 1 week only, but continues that dose.  Still has f/u appointment with Sleep program at [facility]. 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 Not able to go to [facility] any longer and would like CY to follow her for this and treat. [facility] gave modafanil 200, Vyvanse 70, ordered split PSG on 10/19/13 Modafanil was approved but she says she has not been taking it. Sounds as if [facility] wanted to try Xyrem- she was afraid of it. They wanted her to have another sleep study she can't afford.  She wants to try Desoxyn (methamphetamine) she learned about from on-line narcolepsy site.   01/09/2015-45 year old female never smoker followed for narcolepsy without cataplexy, history UPPP, asthma. Son has hepatoblastoma in remission FOLLOWS FOR: Pt states worsening narcolepsy since having a child. States she is tired all the time  [facility]son had her on "dislexamfetamine" (Vyvanse?) 70 mg daily, ordered modafinil 200 and ordered sleep study at last ov 6/24//15 Avoid tolerance she asks about alternating between Adderall and are and Vyvanse, promising not to take both same day. Now has second baby with full-time partner, not nursing. Mother died of ALS Getting more regular sleep. Would like to lose weight. We discussed interaction between stimulant weight loss drugs and her prescribed stimulants. She denies palpitation.  07/10/2015-45 year old female never smoker followed for Narcolepsy without cataplexy, history UPPP, asthma FOLLOWS FOR: continues to have extreme fatigue; worse than ever now. Pt did not feel that the Adderall helped. Here with 2 small children and admits caring for them limits her ability to nap. Always feels  tired/sleepy. No cataplexy described. Apparently has had abnormal thyroid function and is being referred to endocrinologist in [place] next week.  ROS-see HPI Constitutional:   No-   weight loss, night sweats, fevers, chills,+ fatigue, lassitude. HEENT:   No-  headaches, difficulty swallowing, tooth/dental problems, sore throat,       No-  sneezing, itching, ear ache, nasal congestion, post nasal drip,  CV:  No-   chest pain, orthopnea, PND, swelling in lower extremities, anasarca, dizziness, palpitations Resp: No-   shortness of breath with exertion or at rest.              No-   productive cough,  No non-productive cough,  No- coughing up of blood.              No-   change in color of mucus.  No- wheezing.   Skin: No-   rash or lesions. GI:  No-   heartburn, indigestion, abdominal pain, nausea, vomiting,  GU:  MS:  No-   joint pain or swelling.   Neuro-     nothing unusual Psych:  No- change in mood or affect. + depression +anxiety.  No memory loss.  OBJ- Physical Exam  General- Alert, Oriented, Affect-appropriate, Distress- none physical. + overweight. Skin- rash-none, lesions- none, excoriation- none.  Lymphadenopathy- none Head- atraumatic  Eyes- Gross vision intact, PERRLA, conjunctivae and secretions clear            Ears- Hearing, canals-normal            Nose- Clear, no-Septal dev, mucus, polyps, erosion, perforation             Throat- Mallampati II , mucosa clear , drainage- none, tonsils- atrophic Neck- flexible , trachea midline, no stridor , thyroid nl, carotid no bruit Chest - symmetrical excursion , unlabored           Heart/CV- RRR , no murmur , no gallop  , no rub, nl s1 s2                           - JVD- none , edema- none, stasis changes- none, varices- none           Lung- clear to P&A, wheeze- none, cough- none , dullness-none, rub- none           Chest wall-  Abd-  Br/ Gen/ Rectal- Not done, not indicated Extrem- cyanosis- none, clubbing,  none, atrophy- none, strength- nl Neuro- grossly intact to observation. No tremor. Not obviously sleepy. Seems emotionally engaged.

## 2015-07-10 NOTE — Assessment & Plan Note (Signed)
I don't have anything better to offer her therapeutically that she can afford at this time. She did not want to try Xyrem when offered by [facility]. If thyroid function is abnormal that may be a very helpful direction to explore through her new endocrinologist in [place]. Plan-continue Vyvanse for now

## 2015-07-13 DIAGNOSIS — E0590 Thyrotoxicosis, unspecified without thyrotoxic crisis or storm: Secondary | ICD-10-CM | POA: Diagnosis not present

## 2015-07-31 IMAGING — US US OB TRANSVAGINAL
1 series · 13 of 26 positions shown · non-contrast
Comparison: Pelvic ultrasound performed 01/14/2013

CLINICAL DATA: Passed tissue; assess for spontaneous abortion.

EXAM:
TRANSVAGINAL OB ULTRASOUND
TECHNIQUE: Transvaginal ultrasound was performed for complete evaluation of the
gestation as well as the maternal uterus, adnexal regions, and
pelvic cul-de-sac.

[Series 1: us ob transvaginal · 26 acquisitions, 13 frames shown]
[im 2/26]
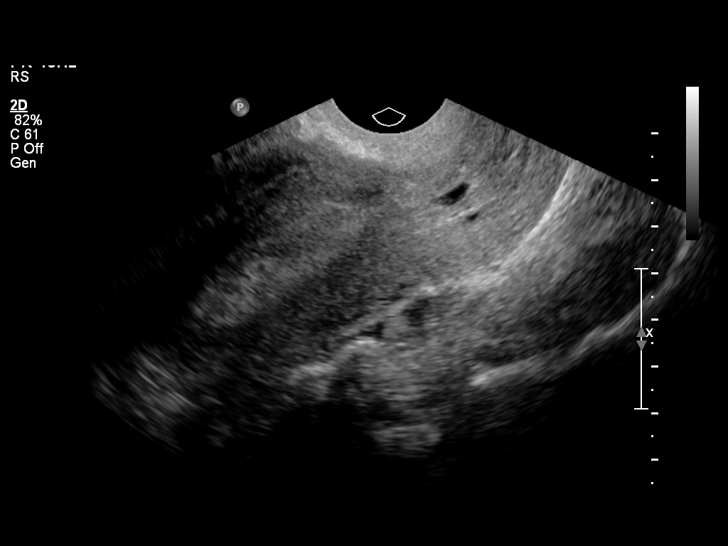
[im 4/26]
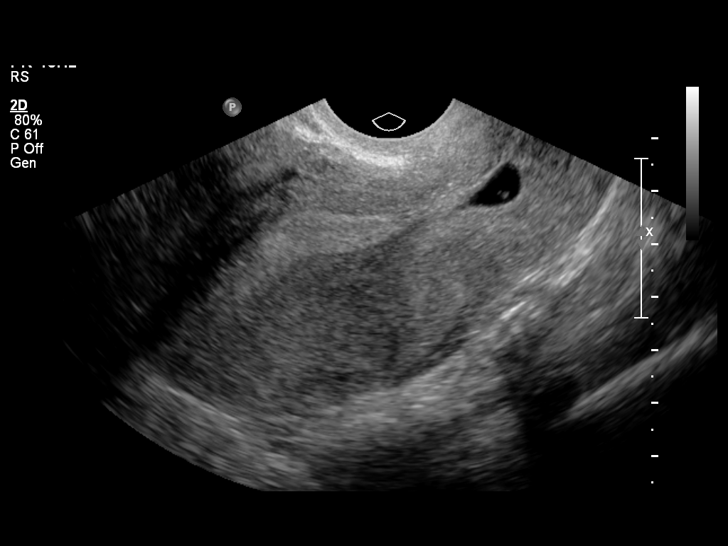
[im 6/26]
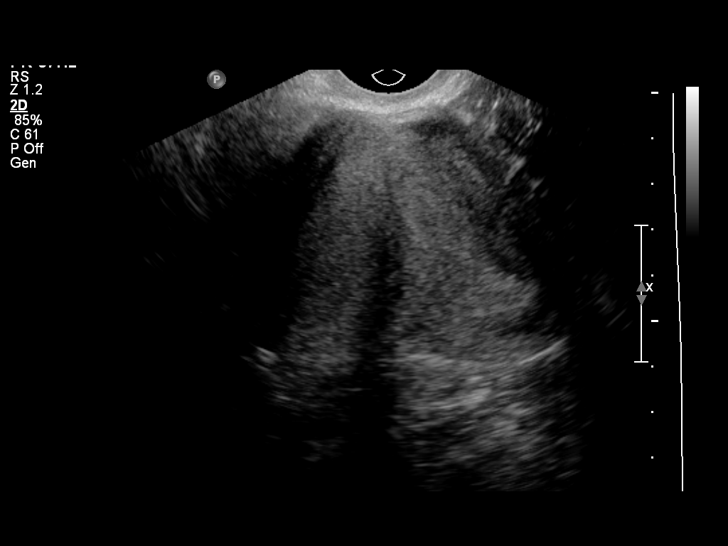
[im 8/26]
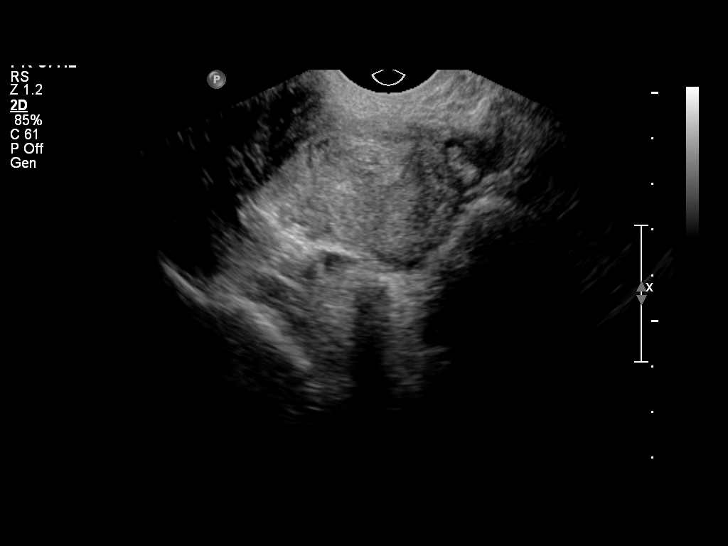
[im 10/26]
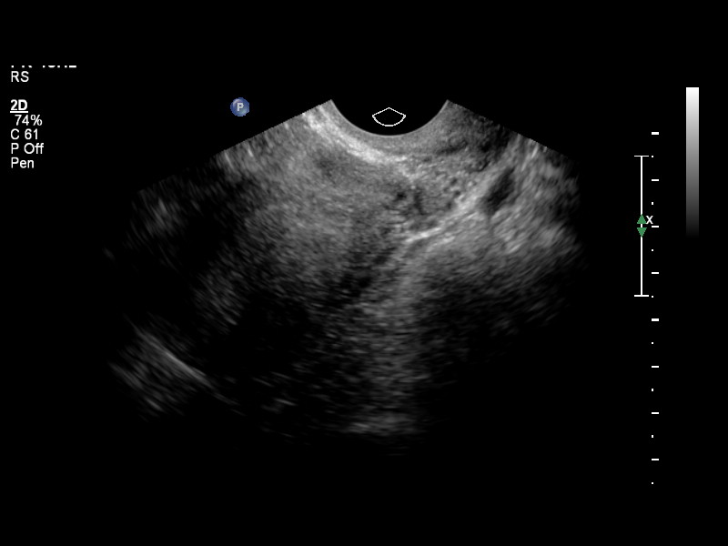
[im 12/26]
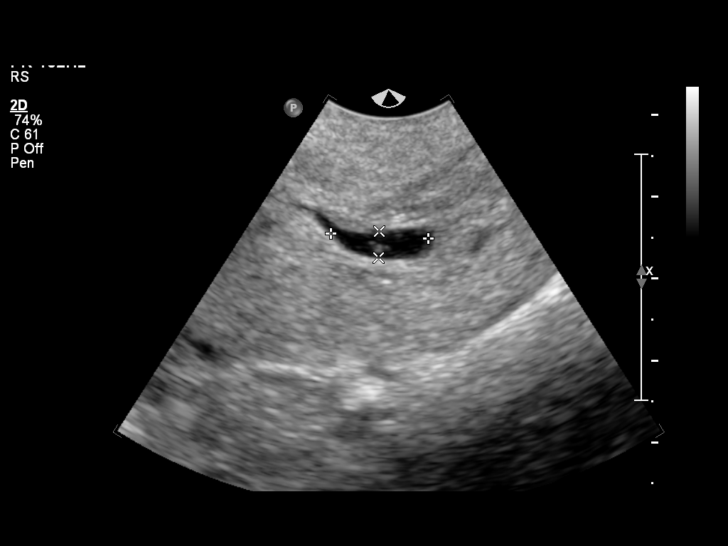
[im 14/26]
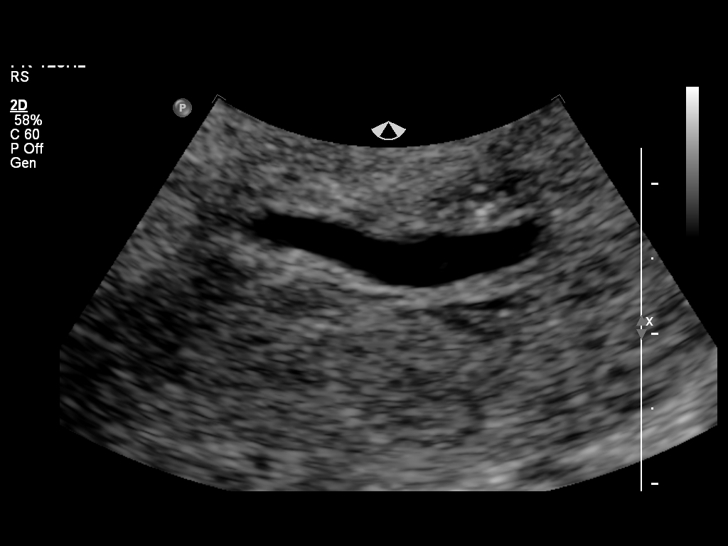
[im 16/26]
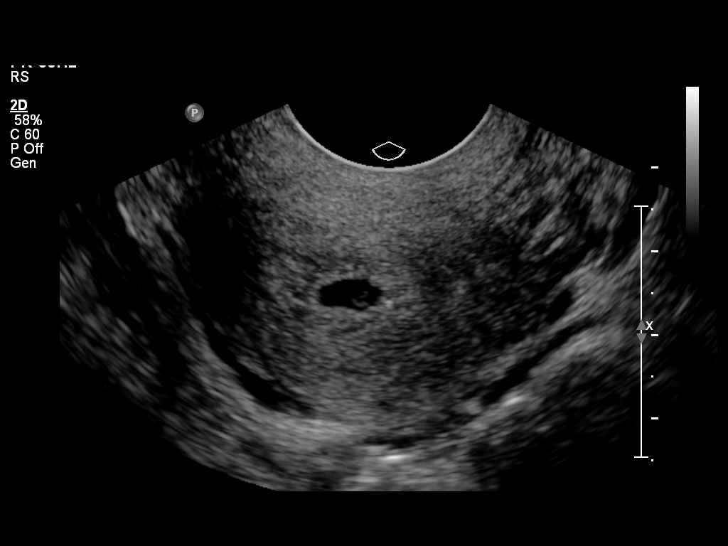
[im 18/26]
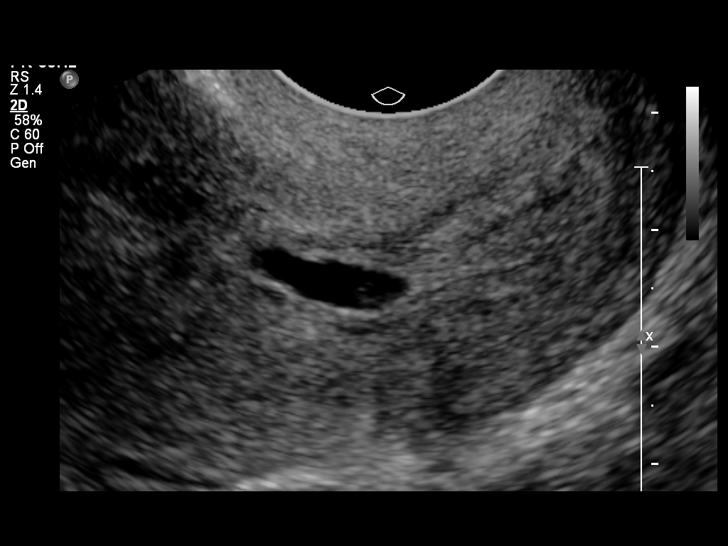
[im 20/26]
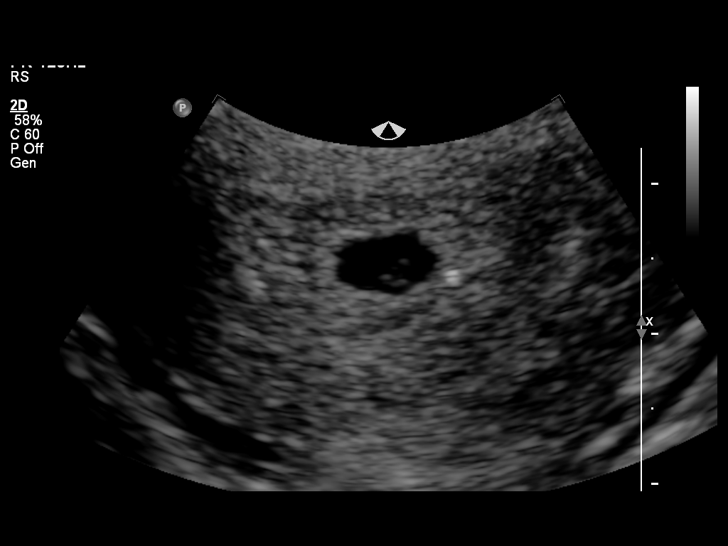
[im 22/26]
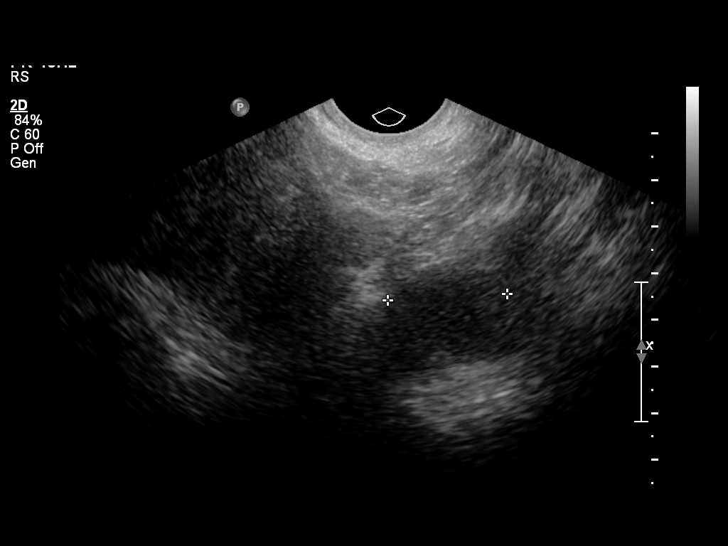
[im 24/26]
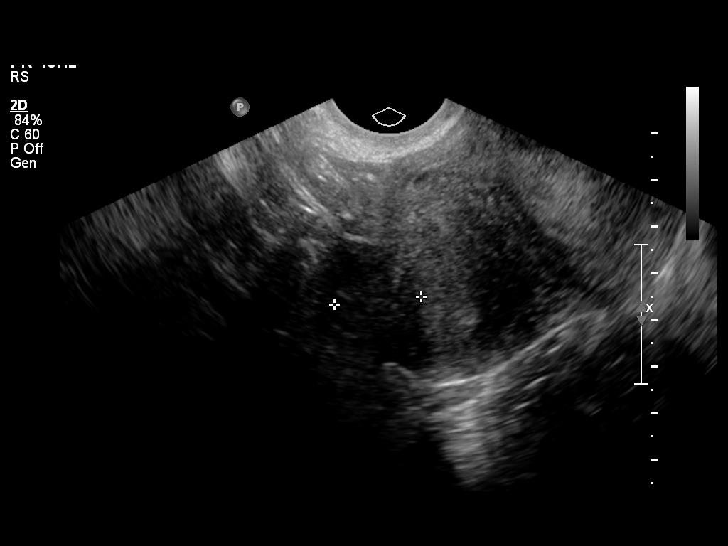
[im 26/26]
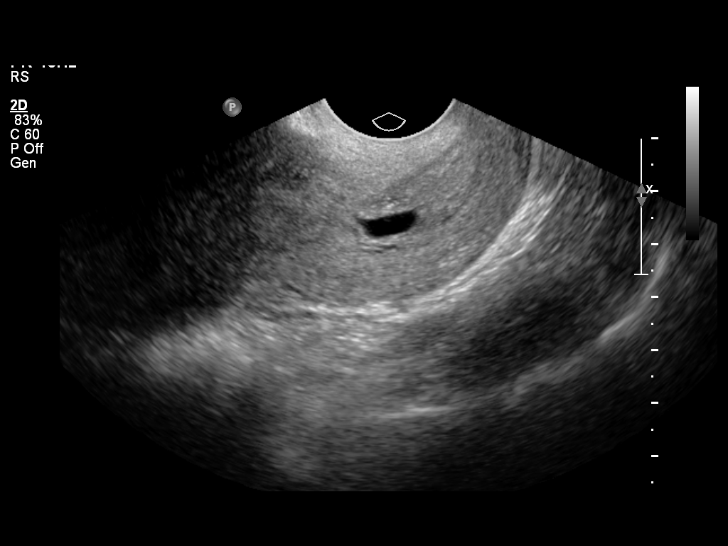

[13 of 26 positions shown; findings below may reference images not displayed]

FINDINGS: Intrauterine gestational sac: Visualized / irregular in appearance;
the gestational sac is noted progressing distally along the lower
uterine segment and cervix.

Yolk sac:  Yes

Embryo:  No

Cardiac Activity: N/A

MSD: 7.5  mm   5 w   3  d

Maternal uterus/adnexae: The uterus is otherwise unremarkable in
appearance. The ovaries are within normal limits. The right ovary
measures 2.4 x 1.5 x 1.9 cm, while the left ovary measures 3.1 x
x 2.6 cm. No suspicious adnexal masses are seen; there is no
definite evidence for ovarian torsion. The ovaries are difficult to
fully characterize on this study.

No free fluid is seen within the pelvic cul-de-sac.
IMPRESSION: Intrauterine gestational sac noted progressing distally along the
lower uterine segment and cervix, compatible with spontaneous
abortion in progress. A yolk sac is seen; the embryo is not yet
visualized.

## 2015-08-06 ENCOUNTER — Telehealth: Payer: Self-pay | Admitting: Internal Medicine

## 2015-08-06 MED ORDER — LISDEXAMFETAMINE DIMESYLATE 70 MG PO CAPS: 70.0000 mg | ORAL_CAPSULE | Freq: Every day | ORAL | Status: DC

## 2015-08-06 MED ORDER — LISDEXAMFETAMINE DIMESYLATE 40 MG PO CAPS: 40.0000 mg | ORAL_CAPSULE | ORAL | Status: DC

## 2015-08-06 NOTE — Telephone Encounter (Signed)
Called spoke with pt. Aware RX's ready for pick up. Nothing further needed

## 2015-08-06 NOTE — Telephone Encounter (Signed)
Ok to refill 

## 2015-08-06 NOTE — Telephone Encounter (Signed)
Pt is requesting refill on vyvanse 70 mg and 40 mg. Last refilled 06/26/15 #30 x 0 refills Please advise Dr. Annamaria Boots thanks

## 2015-08-09 IMAGING — US US OB TRANSVAGINAL
1 series · 14 of 15 positions shown · non-contrast
Comparison: 01/15/2013

CLINICAL DATA: Pregnancy with inconclusive viability. Vaginal
bleeding.

EXAM:
TRANSVAGINAL OB ULTRASOUND
TECHNIQUE: Transvaginal ultrasound was performed for complete evaluation of the
gestation as well as the maternal uterus, adnexal regions, and
pelvic cul-de-sac.

[Series 1: us ob transvaginal · 15 acquisitions, 14 frames shown]
[im 1/15]
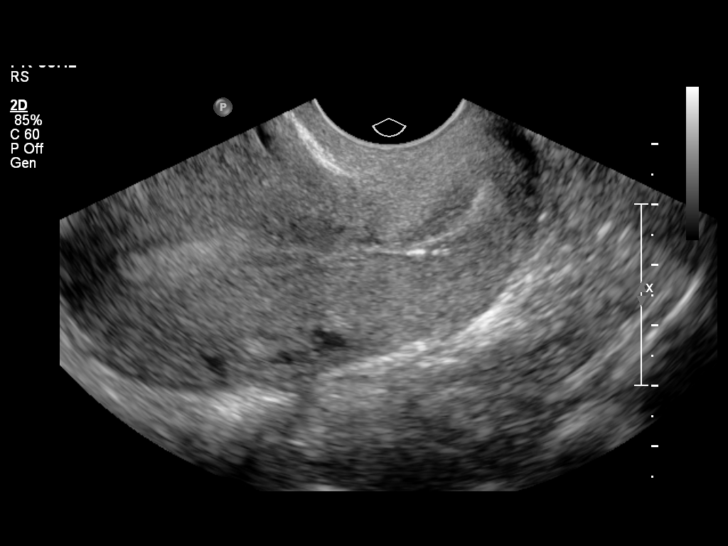
[im 2/15]
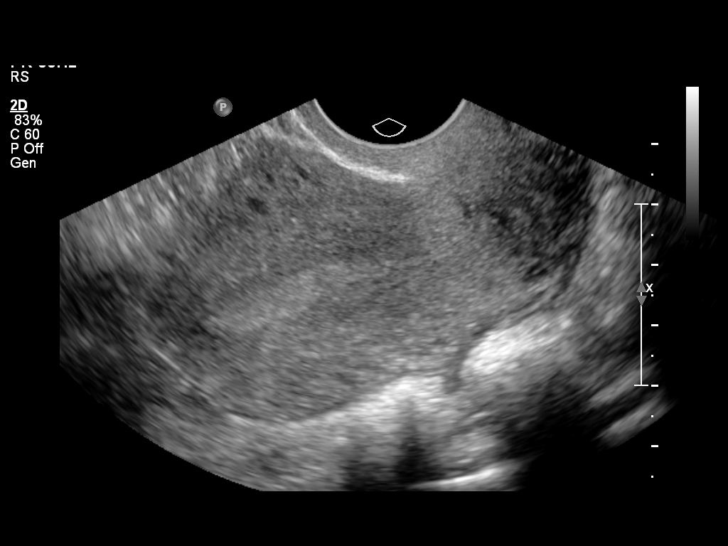
[im 3/15]
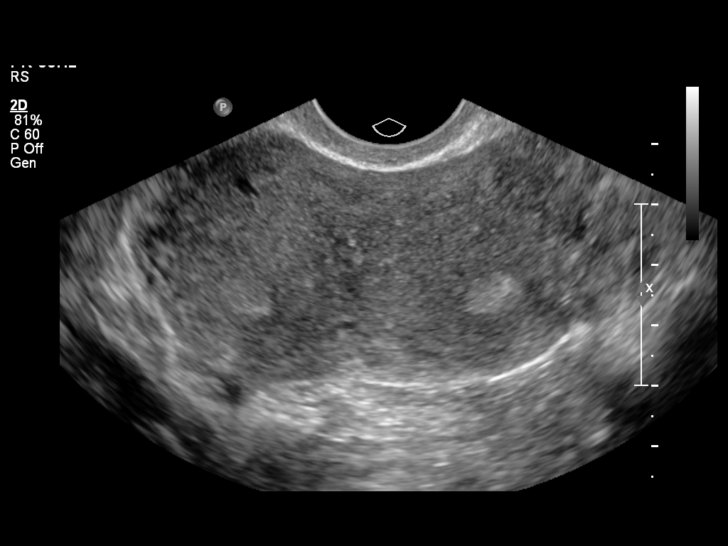
[im 4/15]
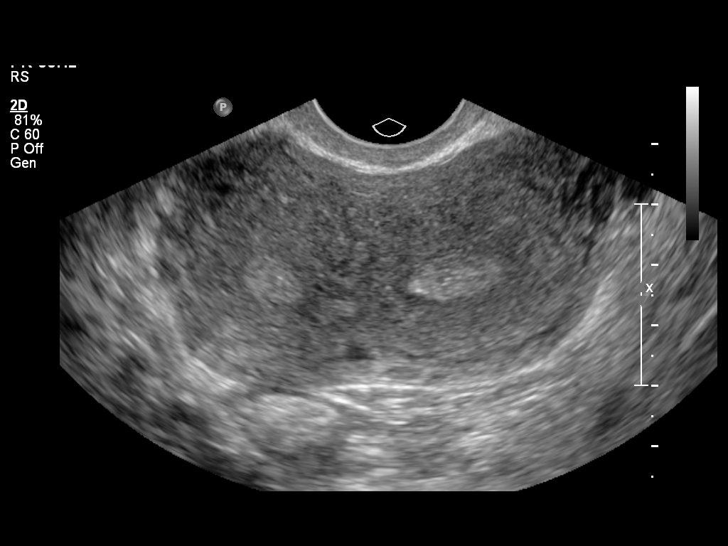
[im 5/15]
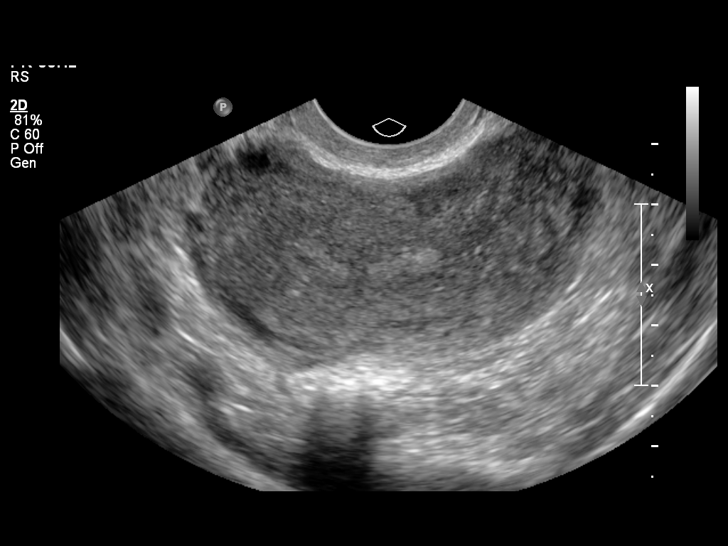
[im 6/15]
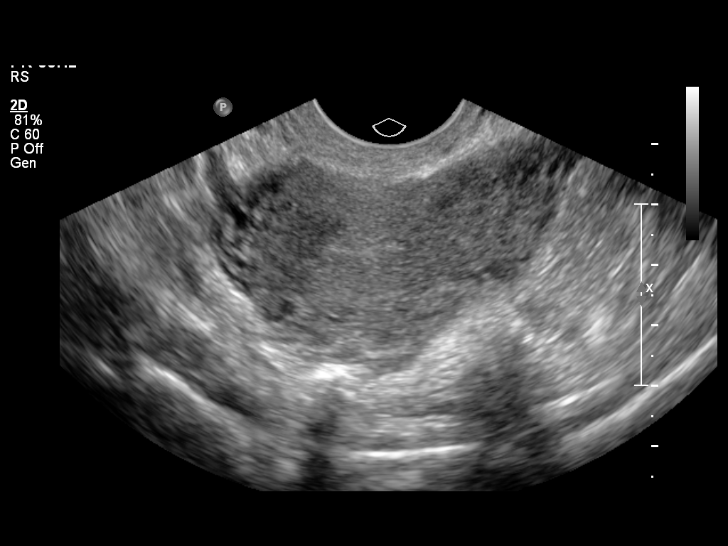
[im 7/15]
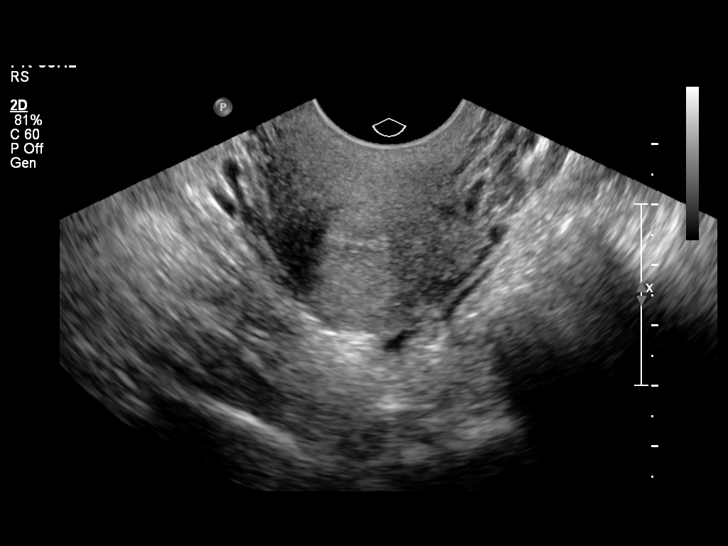
[im 9/15]
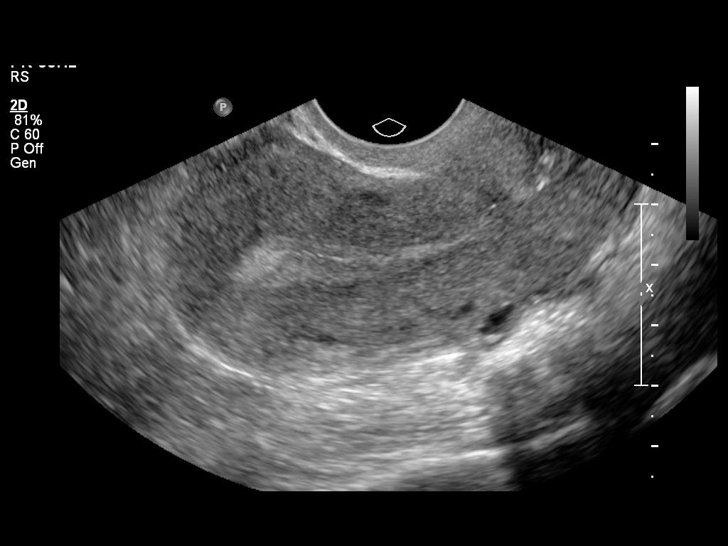
[im 10/15]
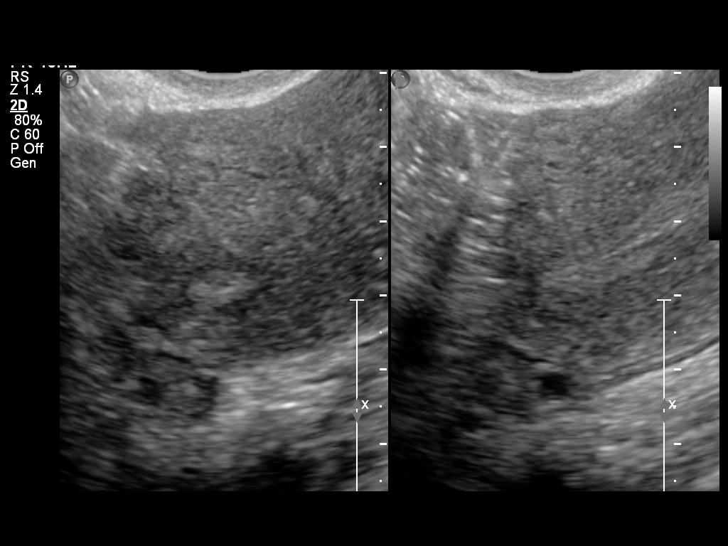
[im 11/15]
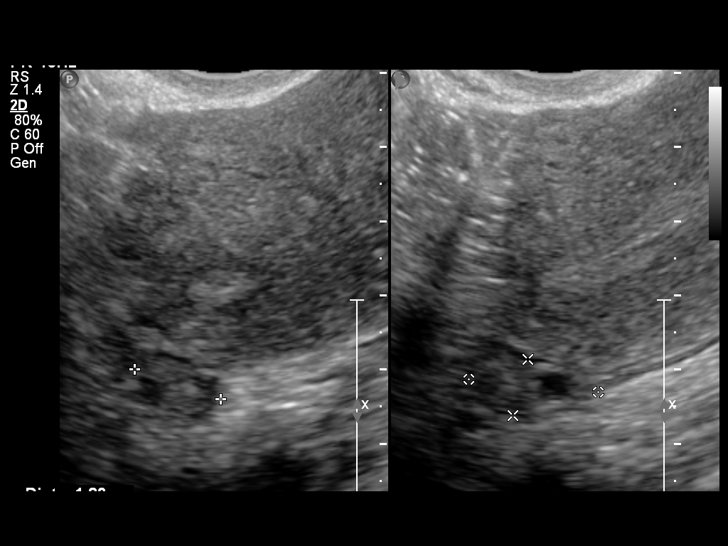
[im 12/15]
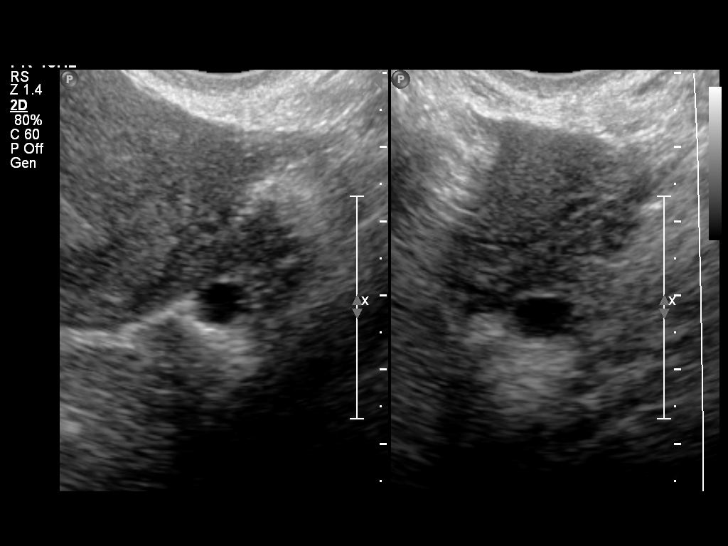
[im 13/15]
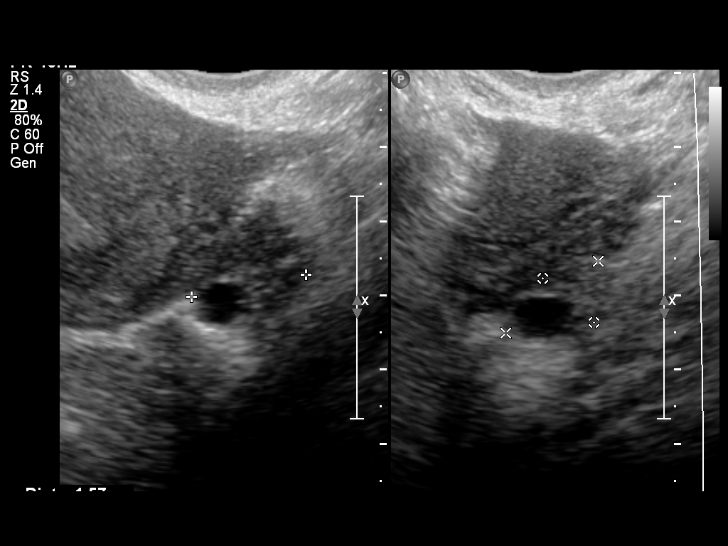
[im 14/15]
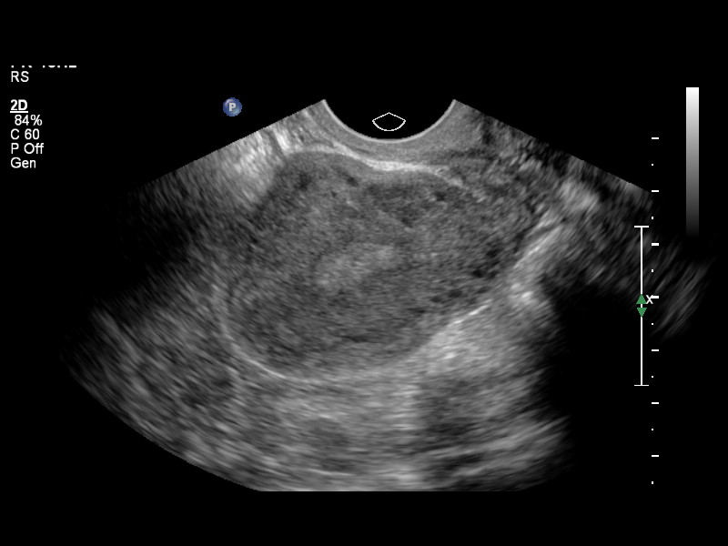
[im 15/15]
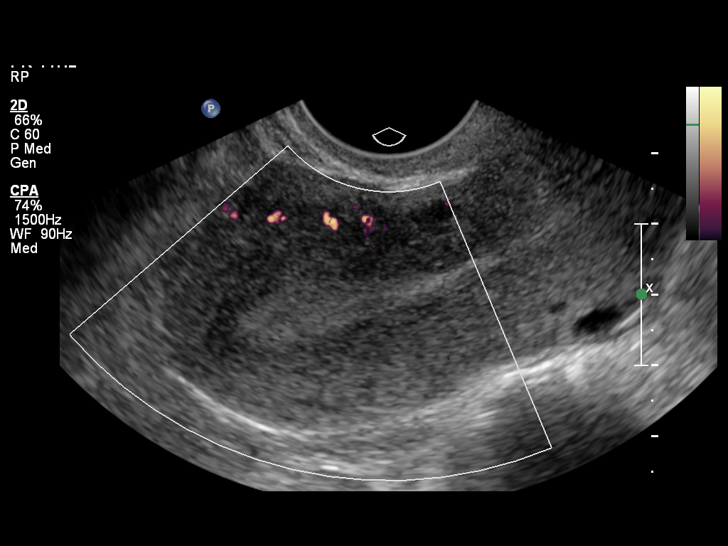

[14 of 15 positions shown; findings below may reference images not displayed]

FINDINGS: Previously seen small and irregular intrauterine gestational sac is
no longer visualized, consistent with completed SAB.

The endometrial cavity is divided into 2 portions by a myometrial
septum, and the fundal contour of the uterus shows no definite
indentation on 3D volume imaging. This is most likely due to a
septate uterus. No fibroids identified.

Both ovaries are normal in appearance. No evidence of adnexal mass
or free fluid.
IMPRESSION: Previously noted intrauterine gestational sac no longer visualized,
consistent with completed SAB.

Mullerian duct anomaly, most likely a septate uterus. Recommend
nonemergent pelvic MRI without contrast for further evaluation.

## 2015-09-04 ENCOUNTER — Telehealth: Payer: Self-pay | Admitting: Internal Medicine

## 2015-09-04 MED ORDER — LISDEXAMFETAMINE DIMESYLATE 40 MG PO CAPS: 40.0000 mg | ORAL_CAPSULE | ORAL | 0 refills | Status: DC

## 2015-09-04 MED ORDER — LISDEXAMFETAMINE DIMESYLATE 70 MG PO CAPS: 70.0000 mg | ORAL_CAPSULE | Freq: Every day | ORAL | 0 refills | Status: DC

## 2015-09-04 NOTE — Telephone Encounter (Signed)
Called and spoke with pt and she stated that she is needing refill of the vyvanse.  These have been printed out and placed on CY cart to be signed.  Pt is aware to come by the office tomorrow to pick these up.  Pt was last seen by CY on 07/10/2015

## 2015-09-05 NOTE — Telephone Encounter (Signed)
KW please advise if this rx has been signed.  Thanks!

## 2015-09-05 NOTE — Telephone Encounter (Signed)
I have not been given any Rx's from CY-if signed yesterday afternoon he returned to Triage.

## 2015-09-05 NOTE — Telephone Encounter (Signed)
Looked at the front and this was already placed for pick up. Called made pt aware. Nothing further needed

## 2015-10-01 ENCOUNTER — Telehealth: Payer: Self-pay | Admitting: Internal Medicine

## 2015-10-01 NOTE — Telephone Encounter (Signed)
Received letter from [place] denying Vyvanse. Per Dr. Annamaria Boots, change to Concerta 54mg  QD #30.  Called and spoke with patient, she said that she will contact her insurance company and get back to [place]. She does not want to change medications.  She said she has been on Vyvanse for years.    Will send to [facility] for follow up.

## 2015-10-23 ENCOUNTER — Telehealth: Payer: Self-pay | Admitting: Internal Medicine

## 2015-10-23 MED ORDER — LISDEXAMFETAMINE DIMESYLATE 40 MG PO CAPS: 40.0000 mg | ORAL_CAPSULE | ORAL | 0 refills | Status: DC

## 2015-10-23 MED ORDER — ALBUTEROL SULFATE HFA 108 (90 BASE) MCG/ACT IN AERS: 2.0000 | INHALATION_SPRAY | RESPIRATORY_TRACT | 6 refills | Status: DC | PRN

## 2015-10-23 MED ORDER — LISDEXAMFETAMINE DIMESYLATE 70 MG PO CAPS: 70.0000 mg | ORAL_CAPSULE | Freq: Every day | ORAL | 0 refills | Status: DC

## 2015-10-23 NOTE — Telephone Encounter (Signed)
Called and spoke with pt and she is needing a refill of the vyvanse 40 mg and 70 mg.  Pt is aware that the albuterol HFA has been sent to her new pharmacy.  CY please advise if ok to refill the vyvanse. Thanks  Last ov--07/10/2015 Next ov--01/24/2016  Allergies  Allergen Reactions  . Pristiq [Desvenlafaxine]     headaches

## 2015-10-23 NOTE — Telephone Encounter (Signed)
Ok to refill both?? 

## 2015-10-23 NOTE — Telephone Encounter (Signed)
rx have been signed by CY and placed up front for pt to pick these up. I have called the pt to make her aware.

## 2015-10-29 ENCOUNTER — Telehealth: Payer: Self-pay | Admitting: Internal Medicine

## 2015-10-29 NOTE — Telephone Encounter (Signed)
Spoke with pharmacist and advised that pt takes both 40 and 70 mg of Vyvanse  Nothing further needed

## 2015-11-01 ENCOUNTER — Telehealth: Payer: Self-pay | Admitting: Internal Medicine

## 2015-11-01 NOTE — Telephone Encounter (Signed)
Pharmacy called for PA on Vyvanse.   Vyvanse has been approved through 10/31/2016 Pharmacy has been notified. Nothing further needed.

## 2015-11-22 DIAGNOSIS — D485 Neoplasm of uncertain behavior of skin: Secondary | ICD-10-CM | POA: Diagnosis not present

## 2015-11-22 DIAGNOSIS — L720 Epidermal cyst: Secondary | ICD-10-CM | POA: Diagnosis not present

## 2016-01-24 ENCOUNTER — Ambulatory Visit: Payer: Medicaid Other | Admitting: Internal Medicine

## 2016-02-05 ENCOUNTER — Telehealth: Payer: Self-pay | Admitting: Internal Medicine

## 2016-02-05 MED ORDER — LISDEXAMFETAMINE DIMESYLATE 40 MG PO CAPS: 40.0000 mg | ORAL_CAPSULE | ORAL | 0 refills | Status: DC

## 2016-02-05 MED ORDER — LISDEXAMFETAMINE DIMESYLATE 70 MG PO CAPS: 70.0000 mg | ORAL_CAPSULE | Freq: Every day | ORAL | 0 refills | Status: DC

## 2016-02-05 NOTE — Telephone Encounter (Signed)
rx have been printed out and placed on CY cart to be signed once he returns to the office.  Will mail these to the pt once completed and will call her once these are placed in the mail.

## 2016-02-05 NOTE — Telephone Encounter (Signed)
Called and spoke with pt and she is aware that the rx's have been placed in the mail today. Nothing further is needed.

## 2016-02-14 DIAGNOSIS — Z23 Encounter for immunization: Secondary | ICD-10-CM | POA: Diagnosis not present

## 2016-02-24 DIAGNOSIS — R350 Frequency of micturition: Secondary | ICD-10-CM | POA: Diagnosis not present

## 2016-03-27 ENCOUNTER — Ambulatory Visit: Payer: Medicaid Other | Admitting: Internal Medicine

## 2016-04-22 DIAGNOSIS — N6489 Other specified disorders of breast: Secondary | ICD-10-CM | POA: Diagnosis not present

## 2016-04-22 DIAGNOSIS — Z1231 Encounter for screening mammogram for malignant neoplasm of breast: Secondary | ICD-10-CM | POA: Diagnosis not present

## 2016-04-22 DIAGNOSIS — R928 Other abnormal and inconclusive findings on diagnostic imaging of breast: Secondary | ICD-10-CM | POA: Diagnosis not present

## 2016-04-23 ENCOUNTER — Telehealth: Payer: Self-pay | Admitting: Internal Medicine

## 2016-04-23 MED ORDER — LISDEXAMFETAMINE DIMESYLATE 40 MG PO CAPS: 40.0000 mg | ORAL_CAPSULE | ORAL | 0 refills | Status: DC

## 2016-04-23 MED ORDER — LISDEXAMFETAMINE DIMESYLATE 70 MG PO CAPS: 70.0000 mg | ORAL_CAPSULE | Freq: Every day | ORAL | 0 refills | Status: DC

## 2016-04-23 NOTE — Telephone Encounter (Signed)
Spoke with pt and informed her of CY's okay to refill. Pt states she will come to the office tomorrow 04/24/16 to pick up both rxs. The rxs were printed and placed on CY's cart. Will await his sig.

## 2016-04-23 NOTE — Telephone Encounter (Signed)
Ok to refill 

## 2016-04-23 NOTE — Telephone Encounter (Signed)
Pt is requesting refills of her Vyvanse 40mg  and 70mg  Pt wants to pick this up. Pt is going to run out before her follow up appt in May.  Last filled 02/05/16, #30 each  Please advise Dr Annamaria Boots. Thanks.    Allergies as of 04/23/2016      Reactions   Pristiq [desvenlafaxine]    headaches      Medication List       Accurate as of 04/23/16 12:42 PM. Always use your most recent med list.          albuterol 108 (90 Base) MCG/ACT inhaler Commonly known as:  PROVENTIL HFA;VENTOLIN HFA Inhale 2 puffs into the lungs every 4 (four) hours as needed.   buPROPion 300 MG 24 hr tablet Commonly known as:  WELLBUTRIN XL Take 1 tablet by mouth daily.   lisdexamfetamine 40 MG capsule Commonly known as:  VYVANSE Take 1 capsule (40 mg total) by mouth every morning.   lisdexamfetamine 70 MG capsule Commonly known as:  VYVANSE Take 1 capsule (70 mg total) by mouth daily.   multivitamin tablet Take 1 tablet by mouth daily.   PRILOSEC 20 MG capsule Generic drug:  omeprazole Take 20 mg by mouth daily. Reported on 07/10/2015

## 2016-04-25 NOTE — Telephone Encounter (Signed)
Rx placed in brown folder up front for pick up. Lm to make pt aware.  Nothing further needed.

## 2016-05-02 ENCOUNTER — Ambulatory Visit: Payer: Medicaid Other | Admitting: Internal Medicine

## 2016-06-18 ENCOUNTER — Ambulatory Visit (INDEPENDENT_AMBULATORY_CARE_PROVIDER_SITE_OTHER): Payer: Medicare Other | Admitting: Internal Medicine

## 2016-06-18 ENCOUNTER — Encounter: Payer: Self-pay | Admitting: Internal Medicine

## 2016-06-18 DIAGNOSIS — G47419 Narcolepsy without cataplexy: Secondary | ICD-10-CM

## 2016-06-18 MED ORDER — AMPHETAMINE-DEXTROAMPHET ER 30 MG PO CP24: 30.0000 mg | ORAL_CAPSULE | Freq: Every day | ORAL | 0 refills | Status: DC

## 2016-06-18 NOTE — Progress Notes (Signed)
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 Marland Kitchen 07/10/2015-46 year old female never smoker followed for Narcolepsy without cataplexy, history UPPP, asthma FOLLOWS FOR: continues to have extreme fatigue; worse than ever now. Pt did not feel that the Adderall helped. Here with 2 small children and admits caring for them limits her ability to nap. Always feels tired/sleepy. No cataplexy described. Apparently has had abnormal thyroid function and is being referred to endocrinologist in [place] next week.  06/18/16- 46 year old female never smoker followed for Narcolepsy without cataplexy, history UPPP, asthma, complicated by obesity FOLLOW UP FOR narcolepsy patient states that she dosen't think that the medicine is working for her. Continues Vyvanse and Wellbutrin. She think she sleeps soundly at night and is not aware of snoring. She wants "energy medicine", complaining of always feeling tired and gaining weight. Weight is up about 9 pounds in the last 2 years. Thyroid has been tested and she is seeing endocrinology with no treatment required. Currently denies pregnancy or nursing. No sleep paralysis or cataplexy. No anginal pain, syncope or palpitation.  ROS-see HPI Constitutional:   No-   weight loss, night sweats, fevers, chills,+ fatigue, lassitude. HEENT:   No-  headaches, difficulty swallowing, tooth/dental problems, sore throat,       No-  sneezing, itching, ear ache, nasal congestion, post nasal drip,  CV:  No-   chest pain, orthopnea, PND, swelling in lower extremities, anasarca, dizziness,  palpitations Resp: No-   shortness of breath with exertion or at rest.              No-   productive cough,  No non-productive cough,  No- coughing up of blood.              No-   change in color of mucus.  No- wheezing.   Skin: No-   rash or lesions. GI:  No-   heartburn, indigestion, abdominal pain, nausea, vomiting,  GU:  MS:  No-   joint pain or swelling.   Neuro-     nothing unusual Psych:  No- change in mood or affect. + depression +anxiety.  No memory loss.  OBJ- Physical Exam  General- Alert, Oriented, Affect-appropriate, Distress- none physical. + overweight. Skin- rash-none, lesions- none, excoriation- none.  Lymphadenopathy- none Head- atraumatic            Eyes- Gross vision intact, PERRLA, conjunctivae and secretions clear            Ears- Hearing, canals-normal            Nose- Clear, no-Septal dev, mucus, polyps, erosion, perforation             Throat- Mallampati II , mucosa clear , drainage- none, tonsils- atrophic Neck- flexible , trachea midline, no stridor , thyroid nl, carotid no bruit Chest - symmetrical excursion , unlabored           Heart/CV- RRR , no murmur , no gallop  , no rub, nl s1 s2                           - JVD- none , edema- none, stasis changes- none, varices- none  Lung- clear to P&A, wheeze- none, cough- none , dullness-none, rub- none           Chest wall-  Abd-  Br/ Gen/ Rectal- Not done, not indicated Extrem- cyanosis- none, clubbing, none, atrophy- none, strength- nl Neuro- grossly intact to observation. No tremor. Not obviously sleepy. Seems emotionally engaged.

## 2016-06-18 NOTE — Assessment & Plan Note (Signed)
It is difficult to distinguish organic/narcolepsy daytime sleepiness from symptoms of depression for which she has a history. She continues on Wellbutrin managed elsewhere. I don't think her pattern has significantly changed over the last few years. She is frustrated that I think stable. We did discuss alternatives and agreed to try Adderall again with safety discussion. Plan-Adderall XR 30 mg daily

## 2016-06-18 NOTE — Patient Instructions (Signed)
Script printed for adderall XR 30 mg,  1 daily       Remember naps when you can are better than pills.  Be safe driving.

## 2016-07-16 ENCOUNTER — Telehealth: Payer: Self-pay | Admitting: Internal Medicine

## 2016-07-16 NOTE — Telephone Encounter (Signed)
Patient returned call, CB is [phone]

## 2016-07-16 NOTE — Telephone Encounter (Signed)
Spoke with pt, states that her adderall is not helping with her daytime sleepiness, would like to switch back to the Vyvanse- 70mg  and 40mg  tabs.   Pt is ok with waiting until CY's return to see if he's ok with switching her back to Vyvanse.  Pt would like to pick up rx's from office when ready.  CY please advise if ok to switch back to Vyvanse, and please advise on dosage/directions.  Thanks!

## 2016-07-16 NOTE — Telephone Encounter (Signed)
lmomtcb x1 

## 2016-07-20 DIAGNOSIS — N3000 Acute cystitis without hematuria: Secondary | ICD-10-CM | POA: Diagnosis not present

## 2016-07-20 DIAGNOSIS — R300 Dysuria: Secondary | ICD-10-CM | POA: Diagnosis not present

## 2016-07-20 NOTE — Telephone Encounter (Signed)
Ok to change back to Vyvanse as requested, with 1 month's worth of both dosages. Remove Adderall from her med list.

## 2016-07-21 MED ORDER — LISDEXAMFETAMINE DIMESYLATE 40 MG PO CAPS: 40.0000 mg | ORAL_CAPSULE | ORAL | 0 refills | Status: DC

## 2016-07-21 MED ORDER — LISDEXAMFETAMINE DIMESYLATE 70 MG PO CAPS: 70.0000 mg | ORAL_CAPSULE | Freq: Every day | ORAL | 0 refills | Status: DC

## 2016-07-21 NOTE — Telephone Encounter (Signed)
Spoke with pt. She would like to have these prescriptions mailed to her. Rxs have been placed in the outgoing mail. Nothing further was needed.

## 2016-08-28 ENCOUNTER — Encounter: Payer: Self-pay | Admitting: Internal Medicine

## 2016-09-15 DIAGNOSIS — R140 Abdominal distension (gaseous): Secondary | ICD-10-CM | POA: Diagnosis not present

## 2016-09-15 DIAGNOSIS — R935 Abnormal findings on diagnostic imaging of other abdominal regions, including retroperitoneum: Secondary | ICD-10-CM | POA: Diagnosis not present

## 2016-09-15 DIAGNOSIS — R1084 Generalized abdominal pain: Secondary | ICD-10-CM | POA: Diagnosis not present

## 2016-09-15 DIAGNOSIS — K5900 Constipation, unspecified: Secondary | ICD-10-CM | POA: Diagnosis not present

## 2016-09-15 DIAGNOSIS — R109 Unspecified abdominal pain: Secondary | ICD-10-CM | POA: Diagnosis not present

## 2016-09-15 DIAGNOSIS — R9431 Abnormal electrocardiogram [ECG] [EKG]: Secondary | ICD-10-CM | POA: Diagnosis not present

## 2016-09-15 DIAGNOSIS — R630 Anorexia: Secondary | ICD-10-CM | POA: Diagnosis not present

## 2016-09-16 DIAGNOSIS — R9431 Abnormal electrocardiogram [ECG] [EKG]: Secondary | ICD-10-CM | POA: Diagnosis not present

## 2016-10-02 ENCOUNTER — Telehealth: Payer: Self-pay | Admitting: Student

## 2016-10-02 ENCOUNTER — Telehealth: Payer: Self-pay | Admitting: Internal Medicine

## 2016-10-02 MED ORDER — LISDEXAMFETAMINE DIMESYLATE 70 MG PO CAPS: 70.0000 mg | ORAL_CAPSULE | Freq: Every day | ORAL | 0 refills | Status: DC

## 2016-10-02 MED ORDER — LISDEXAMFETAMINE DIMESYLATE 40 MG PO CAPS: 40.0000 mg | ORAL_CAPSULE | ORAL | 0 refills | Status: DC

## 2016-10-02 NOTE — Telephone Encounter (Signed)
Ok to refill both?? 

## 2016-10-02 NOTE — Telephone Encounter (Signed)
Both rx have been printed out and placed on CY cart to be signed.  Pt would like these mailed to her once they are signed and I have verified the pts address.

## 2016-10-02 NOTE — Telephone Encounter (Signed)
Rx has been placed in outgoing mail. Pt is aware and voiced her understanding. ATC pt to make her aware, unable to leave vm due to mailbox being full.

## 2016-10-02 NOTE — Telephone Encounter (Signed)
Started in error-tr

## 2016-10-02 NOTE — Telephone Encounter (Signed)
CY pt is calling for rx to be mailed to her for the vyvanse.  She takes both dosages.  These were last filled on 7/2 for #30.  Please advise if you are ok with refilling these.  Thanks

## 2016-10-06 ENCOUNTER — Telehealth: Payer: Self-pay | Admitting: Internal Medicine

## 2016-10-06 MED ORDER — BUPROPION HCL ER (XL) 300 MG PO TB24: 300.0000 mg | ORAL_TABLET | Freq: Every day | ORAL | 5 refills | Status: DC

## 2016-10-06 NOTE — Telephone Encounter (Signed)
Please advise if ok to refill Wellbutrin 300 mg once daily. Last filled 06/14/15 Last ob 06/18/16 Next ov 12/19/16 Current Outpatient Prescriptions on File Prior to Visit  Medication Sig Dispense Refill  . albuterol (PROVENTIL HFA;VENTOLIN HFA) 108 (90 Base) MCG/ACT inhaler Inhale 2 puffs into the lungs every 4 (four) hours as needed. 1 Inhaler 6  . buPROPion (WELLBUTRIN XL) 300 MG 24 hr tablet Take 1 tablet by mouth daily.  11  . lisdexamfetamine (VYVANSE) 40 MG capsule Take 1 capsule (40 mg total) by mouth every morning. 30 capsule 0  . lisdexamfetamine (VYVANSE) 70 MG capsule Take 1 capsule (70 mg total) by mouth daily. 30 capsule 0  . Multiple Vitamin (MULTIVITAMIN) tablet Take 1 tablet by mouth daily.    Marland Kitchen omeprazole (PRILOSEC) 20 MG capsule Take 20 mg by mouth daily. Reported on 07/10/2015     No current facility-administered medications on file prior to visit.     Allergies  Allergen Reactions  . Pristiq [Desvenlafaxine]     headaches

## 2016-10-06 NOTE — Telephone Encounter (Signed)
Advised pt of refill for 6 months. Pt understood and nothing further is needed.

## 2016-10-06 NOTE — Telephone Encounter (Signed)
Ok to refill total 6 months 

## 2016-10-21 ENCOUNTER — Telehealth: Payer: Self-pay | Admitting: Internal Medicine
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
## 2016-10-23 DIAGNOSIS — F4322 Adjustment disorder with anxiety: Secondary | ICD-10-CM | POA: Diagnosis not present

## 2016-10-23 DIAGNOSIS — F331 Major depressive disorder, recurrent, moderate: Secondary | ICD-10-CM | POA: Diagnosis not present

## 2016-10-30 ENCOUNTER — Other Ambulatory Visit: Payer: Self-pay | Admitting: Internal Medicine

## 2016-10-30 MED ORDER — BUPROPION HCL ER (XL) 300 MG PO TB24: 300.0000 mg | ORAL_TABLET | Freq: Every day | ORAL | 3 refills | Status: DC

## 2016-10-30 NOTE — Telephone Encounter (Signed)
Dr Annamaria Boots please advise if okay to construct letter, thank you.

## 2016-10-30 NOTE — Telephone Encounter (Signed)
Per CY-okay to refill as requested. Rx has been called to pharmacy voicemail.

## 2016-11-03 ENCOUNTER — Encounter: Payer: Self-pay | Admitting: Internal Medicine

## 2016-11-04 NOTE — Telephone Encounter (Signed)
Mary [name] was this handled?

## 2016-11-04 NOTE — Telephone Encounter (Signed)
Letter done and sent through [facility] yesterday

## 2016-11-04 NOTE — Telephone Encounter (Signed)
CY please advise. thanks

## 2016-11-05 NOTE — Telephone Encounter (Signed)
ATC patient but her VM was full. Need to find out from the patient if she received this letter yet.

## 2016-11-06 NOTE — Telephone Encounter (Signed)
Letter completed and attempted to call the pt to make her aware---VM is full.  Will try back later.

## 2016-11-06 NOTE — Telephone Encounter (Signed)
Patient request for nurse to leave letter at the front desk for pick up.

## 2016-11-06 NOTE — Telephone Encounter (Signed)
Letter has been left up front for the pt.

## 2016-12-02 DIAGNOSIS — B354 Tinea corporis: Secondary | ICD-10-CM | POA: Diagnosis not present

## 2016-12-02 DIAGNOSIS — R635 Abnormal weight gain: Secondary | ICD-10-CM | POA: Diagnosis not present

## 2016-12-02 DIAGNOSIS — Z23 Encounter for immunization: Secondary | ICD-10-CM | POA: Diagnosis not present

## 2016-12-02 DIAGNOSIS — K219 Gastro-esophageal reflux disease without esophagitis: Secondary | ICD-10-CM | POA: Diagnosis not present

## 2016-12-02 DIAGNOSIS — L700 Acne vulgaris: Secondary | ICD-10-CM | POA: Diagnosis not present

## 2016-12-19 ENCOUNTER — Encounter: Payer: Self-pay | Admitting: Internal Medicine

## 2016-12-19 ENCOUNTER — Ambulatory Visit (INDEPENDENT_AMBULATORY_CARE_PROVIDER_SITE_OTHER): Payer: Medicare Other | Admitting: Internal Medicine

## 2016-12-19 DIAGNOSIS — G47419 Narcolepsy without cataplexy: Secondary | ICD-10-CM | POA: Diagnosis not present

## 2016-12-19 MED ORDER — MODAFINIL 100 MG PO TABS: 100.0000 mg | ORAL_TABLET | Freq: Every day | ORAL | 5 refills | Status: DC

## 2016-12-19 MED ORDER — LISDEXAMFETAMINE DIMESYLATE 40 MG PO CAPS: 40.0000 mg | ORAL_CAPSULE | ORAL | 0 refills | Status: DC

## 2016-12-19 MED ORDER — LISDEXAMFETAMINE DIMESYLATE 70 MG PO CAPS: 70.0000 mg | ORAL_CAPSULE | Freq: Every day | ORAL | 0 refills | Status: DC

## 2016-12-19 NOTE — Progress Notes (Signed)
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 Continues Vyvanse and Wellbutrin. She think she sleeps soundly at night and is not aware of snoring. She wants "energy medicine", complaining of always feeling tired and gaining weight. Weight is up about 9 pounds in the last 2 years. Thyroid has been tested and she is seeing endocrinology with no treatment required. Currently denies pregnancy or nursing. No sleep paralysis or cataplexy. No anginal pain, syncope or palpitation.  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     ROS-see HPI Constitutional:   No-   weight loss, night sweats, fevers, chills,+ fatigue, lassitude. HEENT:   No-  headaches, difficulty swallowing, tooth/dental problems, sore throat,       No-  sneezing, itching, ear ache, nasal congestion, post nasal drip,  CV:  No-   chest pain, orthopnea, PND, swelling in lower extremities, anasarca, dizziness, palpitations Resp: No-   shortness of breath with exertion or at rest.              No-   productive cough,  No non-productive cough,  No- coughing up of blood.              No-   change in color of mucus.  No- wheezing.   Skin: No-   rash or  lesions. GI:  No-   heartburn, indigestion, abdominal pain, nausea, vomiting,  GU:  MS:  No-   joint pain or swelling.   Neuro-     nothing unusual Psych:  No- change in mood or affect. + depression +anxiety.  No memory loss.  OBJ- Physical Exam  General- Alert, Oriented, Affect-appropriate, Distress- none physical. + overweight. Skin- rash-none, lesions- none, excoriation- none.  Lymphadenopathy- none Head- atraumatic            Eyes- Gross vision intact, PERRLA, conjunctivae and secretions clear            Ears- Hearing, canals-normal            Nose- Clear, no-Septal dev, mucus, polyps, erosion, perforation             Throat- Mallampati II , mucosa clear , drainage- none, tonsils- atrophic Neck- flexible , trachea midline, no stridor , thyroid nl, carotid no bruit Chest - symmetrical excursion , unlabored           Heart/CV- RRR , no murmur , no gallop  , no rub, nl s1 s2                           - JVD- none , edema- none, stasis changes- none, varices- none           Lung- clear to P&A, wheeze- none, cough- none , dullness-none, rub- none  Chest wall-  Abd-  Br/ Gen/ Rectal- Not done, not indicated Extrem- cyanosis- none, clubbing, none, atrophy- none, strength- nl Neuro- grossly intact to observation. No tremor. Not obviously sleepy. Seems emotionally engaged.

## 2016-12-19 NOTE — Assessment & Plan Note (Addendum)
We discussed her medications.  She wants to feel that she is coping well, and I think she actually is.  She has been stable without concerns of medication misuse.  After discussion, I did agree to let her try adding modafinil, which can be used selectively, on days when needed.  Need to be careful to avoid overstimulation as reviewed.

## 2016-12-19 NOTE — Progress Notes (Signed)
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 Continues Vyvanse and Wellbutrin. She think she sleeps soundly at night and is not aware of snoring. She wants "energy medicine", complaining of always feeling tired and gaining weight. Weight is up about 9 pounds in the last 2 years. Thyroid has been tested and she is seeing endocrinology with no treatment required. Currently denies pregnancy or nursing. No sleep paralysis or cataplexy. No anginal pain, syncope or palpitation.  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 She comes with her daughter today, describing stress of dealing with custody challenge related to her son.  Process is stressful but she is coping okay.  Complains of "no energy".  Asks opportunity to try using modafinil along with her other alerting medicines because she has talked with other narcoleptic's who found this helpful.  Medication talk done.  Emphasis still on adequate sleep including nap opportunities when possible. Denies syncope, chest pain, palpitation.  Working with a therapist "someone to talk to".  ROS-see HPI Constitutional:   No-   weight loss, night sweats, fevers, chills,+ fatigue,  lassitude. HEENT:   No-  headaches, difficulty swallowing, tooth/dental problems, sore throat,       No-  sneezing, itching, ear ache, nasal congestion, post nasal drip,  CV:  No-   chest pain, orthopnea, PND, swelling in lower extremities, anasarca, dizziness, palpitations Resp: No-   shortness of breath with exertion or at rest.              No-   productive cough,  No non-productive cough,  No- coughing up of blood.              No-   change in color of mucus.  No- wheezing.   Skin: No-   rash or lesions. GI:  No-   heartburn, indigestion, abdominal pain, nausea, vomiting,  GU:  MS:  No-   joint pain or swelling.   Neuro-     nothing unusual Psych:  No- change in mood or affect. + depression +anxiety.  No memory loss.  OBJ- Physical Exam  General- Alert, Oriented, Affect-appropriate, Distress- none physical. + overweight. Skin- rash-none, lesions- none, excoriation- none.  Lymphadenopathy- none Head- atraumatic            Eyes- Gross vision intact, PERRLA, conjunctivae and secretions clear            Ears- Hearing, canals-normal            Nose- Clear, no-Septal dev, mucus, polyps, erosion, perforation             Throat- Mallampati II , mucosa clear , drainage- none, tonsils- atrophic Neck- flexible , trachea midline, no stridor , thyroid nl, carotid no bruit Chest - symmetrical excursion , unlabored           Heart/CV-  RRR , no murmur , no gallop  , no rub, nl s1 s2                           - JVD- none , edema- none, stasis changes- none, varices- none           Lung- clear to P&A, wheeze- none, cough- none , dullness-none, rub- none           Chest wall-  Abd-  Br/ Gen/ Rectal- Not done, not indicated Extrem- cyanosis- none, clubbing, none, atrophy- none, strength- nl Neuro- grossly intact to observation. No tremor. Not obviously sleepy. Seems emotionally engaged. Very appropriate interaction with young daughter who comes with her today.

## 2016-12-19 NOTE — Patient Instructions (Signed)
Scripts refilled for Mary [name] for modafinil to add one daily as needed to help with alertness  Please call as needed

## 2016-12-25 DIAGNOSIS — F4322 Adjustment disorder with anxiety: Secondary | ICD-10-CM | POA: Diagnosis not present

## 2017-01-23 ENCOUNTER — Telehealth: Payer: Self-pay | Admitting: Internal Medicine

## 2017-01-23 NOTE — Telephone Encounter (Signed)
Received a request from [facility] stating that the patient needed to have a PA on Provigil 100mg . Started on [email or web address]. Key is [id number].   Request could take up to 2 business days for determination.   Will route to [facility] for follow up.

## 2017-01-23 NOTE — Telephone Encounter (Signed)
PA was approved until 01/21/2018. [facility] is aware and will notify the patient. Will close this encounter.

## 2017-02-24 ENCOUNTER — Other Ambulatory Visit: Payer: Self-pay | Admitting: Internal Medicine

## 2017-03-12 ENCOUNTER — Telehealth: Payer: Self-pay | Admitting: Internal Medicine

## 2017-03-12 NOTE — Telephone Encounter (Signed)
Called patient. She states that she has established care with Dr. Collene Mares and is requesting to be referred back to her.

## 2017-03-12 NOTE — Telephone Encounter (Signed)
Former Dr. Sharlett Iles patient. Dr. Carlean Purl is Doc of the Day for 03/12/17. Patient is being referred for abnormal ct of the abd. Please advise on urgency of scheduling. Thanks.

## 2017-03-12 NOTE — Telephone Encounter (Signed)
Patient can be offered the next available APP or MD opening.  Not urgent.

## 2017-03-25 ENCOUNTER — Other Ambulatory Visit: Payer: Self-pay | Admitting: Internal Medicine

## 2017-04-27 ENCOUNTER — Telehealth: Payer: Self-pay | Admitting: Internal Medicine

## 2017-04-27 NOTE — Telephone Encounter (Signed)
Called and spoke with patient regarding questions for CY Pt is having Bariatric surgery in June 2019 Pt is needing to be seen by the end of this month with CY; no available appts at this time Pt is needing CPAP machine for the surgery, and has multiply questions and concerns for CY CY please advise if earlier appt could be scheduled  Allergies  Allergen Reactions  . Desvenlafaxine     headaches Other reaction(s): Other headaches headaches    Current Outpatient Medications on File Prior to Visit  Medication Sig Dispense Refill  . albuterol (PROVENTIL HFA;VENTOLIN HFA) 108 (90 Base) MCG/ACT inhaler INHALE 2 PUFFS BY MOUTH EVERY 4 HOURS AS NEEDED 18 Inhaler 0  . buPROPion (WELLBUTRIN XL) 300 MG 24 hr tablet Take 1 tablet (300 mg total) by mouth daily. 90 tablet 3  . lisdexamfetamine (VYVANSE) 40 MG capsule Take 1 capsule (40 mg total) by mouth every morning. 30 capsule 0  . lisdexamfetamine (VYVANSE) 70 MG capsule Take 1 capsule (70 mg total) by mouth daily. 30 capsule 0  . modafinil (PROVIGIL) 100 MG tablet Take 1 tablet (100 mg total) by mouth daily. 30 tablet 5  . Multiple Vitamin (MULTIVITAMIN) tablet Take 1 tablet by mouth daily.    Marland Kitchen omeprazole (PRILOSEC) 20 MG capsule Take 20 mg by mouth daily. Reported on 07/10/2015     No current facility-administered medications on file prior to visit.    Marland Kitchenmed

## 2017-04-28 NOTE — Telephone Encounter (Signed)
Katie- please see if there is an opening for her. Otherwise she may need to see NP.

## 2017-04-28 NOTE — Telephone Encounter (Signed)
Called and spoke to patient. Scheduled patient to see Dr. Annamaria Boots on 05/12/17 at 0930, instructed patient to arrive at 0915. Patient verbalized understanding and thanked staff for scheduling her. Nothing further is needed at this time.

## 2017-05-12 ENCOUNTER — Ambulatory Visit: Payer: Medicare Other | Admitting: Internal Medicine

## 2017-05-15 ENCOUNTER — Encounter: Payer: Self-pay | Admitting: Internal Medicine

## 2017-05-15 ENCOUNTER — Ambulatory Visit (INDEPENDENT_AMBULATORY_CARE_PROVIDER_SITE_OTHER): Payer: Medicare Other | Admitting: Internal Medicine

## 2017-05-15 VITALS — BP 128/66 | HR 84 | Ht 68.0 in | Wt 244.8 lb

## 2017-05-15 DIAGNOSIS — G4733 Obstructive sleep apnea (adult) (pediatric): Secondary | ICD-10-CM

## 2017-05-15 DIAGNOSIS — J4520 Mild intermittent asthma, uncomplicated: Secondary | ICD-10-CM

## 2017-05-15 DIAGNOSIS — G47419 Narcolepsy without cataplexy: Secondary | ICD-10-CM | POA: Diagnosis not present

## 2017-05-15 MED ORDER — LISDEXAMFETAMINE DIMESYLATE 40 MG PO CAPS: 40.0000 mg | ORAL_CAPSULE | ORAL | 0 refills | Status: DC

## 2017-05-15 MED ORDER — ALBUTEROL SULFATE HFA 108 (90 BASE) MCG/ACT IN AERS: 2.0000 | INHALATION_SPRAY | RESPIRATORY_TRACT | 12 refills | Status: DC | PRN

## 2017-05-15 MED ORDER — LISDEXAMFETAMINE DIMESYLATE 70 MG PO CAPS: 70.0000 mg | ORAL_CAPSULE | Freq: Every day | ORAL | 0 refills | Status: DC

## 2017-05-15 MED ORDER — MODAFINIL 100 MG PO TABS: 200.0000 mg | ORAL_TABLET | Freq: Every day | ORAL | 5 refills | Status: DC

## 2017-05-15 NOTE — Patient Instructions (Signed)
Script printed for modafinil refill, increasing to 200 mg  Script sent refilling albuterol rescue inhaler  Order- unattended home sleep test   Dx OSA  Please call me about 2 weeks after the sleep test, for results and recommendations.,

## 2017-05-15 NOTE — Assessment & Plan Note (Signed)
With weight gain, it is appropriate to reassess OSA as a possible contributor to her daytime sleepiness. Plan-discussed sleep hygiene.  Continue to get naps when able.  Refill meds.  Schedule sleep study.

## 2017-05-15 NOTE — Progress Notes (Signed)
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 She comes with her daughter today, describing stress of dealing with custody challenge related to her son.  Process is stressful but she is coping okay.  Complains of "no energy".  Asks opportunity to try using modafinil along with her other alerting medicines because she has talked with other narcoleptic's who found this helpful.  Medication talk done.  Emphasis still on adequate sleep including nap opportunities when possible. Denies syncope, chest pain, palpitation.  Working with a therapist "someone to talk to".  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 Pt is having weight loss surgery Weight today 244 lbs Modafinil 100 mg, Vyvanse 40 and 70 Some shortness of breath and occasional wheeze.  Would like rescue inhaler refilled, but only needed occasionally with no sleep disturbance. Has gained weight since previous sleep studies.  Continues to complain of oppressive daytime sleepiness without cataplexy.  Being considered for bariatric surgery and needs OSA status reassessed.  Stimulant medications are the best that she has found but still inadequate  for help with daytime sleepiness.  They do not seem to disturb nighttime sleep.  ROS-see HPI   + = positive Constitutional:   No-   weight loss, night sweats, fevers, chills,+ fatigue, lassitude. HEENT:   No-  headaches, difficulty swallowing, tooth/dental problems, sore throat,       No-  sneezing, itching, ear ache, +nasal congestion, post nasal drip,  CV:  No-   chest pain, orthopnea, PND, swelling in lower extremities, anasarca, dizziness, palpitations Resp: No-   shortness of breath with exertion or at rest.              No-   productive cough,  No non-productive cough,  No- coughing up of blood.              No-   change in color of mucus.  No- wheezing.   Skin: No-   rash or lesions. GI:  No-   heartburn, indigestion, abdominal pain, nausea, vomiting,  GU:  MS:  No-   joint pain or swelling.   Neuro-     nothing unusual Psych:  No- change in mood or affect. + depression +anxiety.  No memory loss.  OBJ- Physical Exam  General- Alert, Oriented, Affect-appropriate, Distress- none physical. + overweight. Skin- rash-none, lesions- none, excoriation- none.  Lymphadenopathy- none Head- atraumatic            Eyes- Gross vision intact, PERRLA, conjunctivae and secretions clear            Ears- Hearing, canals-normal            Nose- Clear, no-Septal dev, mucus, polyps, erosion, perforation             Throat- Mallampati II/ UPPP, mucosa clear , drainage- none, tonsils- atrophic Neck- flexible ,  trachea midline, no stridor , thyroid nl, carotid no bruit Chest - symmetrical excursion , unlabored           Heart/CV- RRR , no murmur , no gallop  , no rub, nl s1 s2                           - JVD- none , edema- none, stasis changes- none, varices- none           Lung- clear to P&A, wheeze- none, cough- none , dullness-none, rub- none           Chest wall-  Abd-  Br/ Gen/ Rectal- Not done, not indicated Extrem- cyanosis- none, clubbing, none, atrophy- none, strength- nl Neuro- grossly  intact to observation. No tremor. Not obviously sleepy. Seems emotionally engaged. Very appropriate interaction with Dessie Tatem daughter who comes with her again today.

## 2017-05-15 NOTE — Assessment & Plan Note (Signed)
May be just a little more symptomatic now than heavy pollen season.  She does have minimal rhinitis symptoms managed OTC.  She is using rescue inhaler less than 3 times per week with no sleep disturbance. Plan-refill rescue inhaler.

## 2017-05-15 NOTE — Assessment & Plan Note (Signed)
Discussed plans for sleep study.  She asks a letter of surgical pulmonary clearance-discussed.

## 2017-06-01 ENCOUNTER — Telehealth: Payer: Self-pay | Admitting: Internal Medicine

## 2017-06-01 NOTE — Telephone Encounter (Signed)
ATC pt, no answer. Left message for pt to call back.  

## 2017-06-02 ENCOUNTER — Encounter: Payer: Self-pay | Admitting: Internal Medicine

## 2017-06-02 NOTE — Telephone Encounter (Signed)
Attempted to call pt. I did not receive an answer. I have left a message for pt to return our call.  

## 2017-06-02 NOTE — Telephone Encounter (Signed)
Done

## 2017-06-02 NOTE — Telephone Encounter (Signed)
Pt called back, states that she is being considered for bariatric surgery and is requesting a letter of surgical clearance from CY.  Pt states that this was discussed in detail at 05/15/17 OV.   Pt would like to pick letter up on 5/17.  CY please advise on letter.  Thanks.

## 2017-06-03 NOTE — Telephone Encounter (Addendum)
Letter has been located on Dr. Janee Morn cart. It has been placed up front for pick up. Pt is aware. Nothing further was needed.

## 2017-06-07 DIAGNOSIS — G4733 Obstructive sleep apnea (adult) (pediatric): Secondary | ICD-10-CM | POA: Diagnosis not present

## 2017-06-08 DIAGNOSIS — G4733 Obstructive sleep apnea (adult) (pediatric): Secondary | ICD-10-CM | POA: Diagnosis not present

## 2017-06-10 ENCOUNTER — Other Ambulatory Visit: Payer: Self-pay | Admitting: *Deleted

## 2017-06-10 DIAGNOSIS — G4733 Obstructive sleep apnea (adult) (pediatric): Secondary | ICD-10-CM

## 2017-06-18 ENCOUNTER — Telehealth: Payer: Self-pay | Admitting: Internal Medicine

## 2017-06-18 ENCOUNTER — Ambulatory Visit: Payer: Medicare Other | Admitting: Internal Medicine

## 2017-06-18 DIAGNOSIS — G4733 Obstructive sleep apnea (adult) (pediatric): Secondary | ICD-10-CM

## 2017-06-18 NOTE — Telephone Encounter (Signed)
Per PCCs pt just had her HST on 5.17.19, not quite 2 weeks ago - results not yet in [employer] spoke with patient, informed her this process typically takes 2-3 weeks for the results to be received and reviewed Patient okay with this and voiced her understanding Patient okay with call back once CY receives results  Routing to Dr Annamaria Boots to advise once results are received, thank you

## 2017-06-25 NOTE — Telephone Encounter (Signed)
Pt is returning call about sleep study results. Cb is [phone].

## 2017-06-25 NOTE — Telephone Encounter (Signed)
Called and spoke with patient, advised her of results from sleep study. Patient agreed to starting CPAP. Order was placed. Patient has been scheduled for follow up. Nothing further needed.

## 2017-07-16 ENCOUNTER — Telehealth: Payer: Self-pay | Admitting: Internal Medicine

## 2017-07-16 NOTE — Telephone Encounter (Signed)
Called and spoke with pt regarding refill request from [facility] for narcolepsy.  Pt is needing refill on Vyvanse 40mg  and 70mg  by next week. Last OV with CY was 05/15/2017. Pt has 6 pills left; advised her CY is out of the office until 07/20/2017.  CY pt would like refill on Vyvanse.  Allergies  Allergen Reactions  . Desvenlafaxine     headaches Other reaction(s): Other headaches headaches   Current Outpatient Medications on File Prior to Visit  Medication Sig Dispense Refill  . albuterol (PROVENTIL HFA;VENTOLIN HFA) 108 (90 Base) MCG/ACT inhaler Inhale 2 puffs into the lungs every 4 (four) hours as needed. 18 Inhaler 12  . buPROPion (WELLBUTRIN XL) 300 MG 24 hr tablet Take 1 tablet (300 mg total) by mouth daily. 90 tablet 3  . lisdexamfetamine (VYVANSE) 40 MG capsule Take 1 capsule (40 mg total) by mouth every morning. 30 capsule 0  . lisdexamfetamine (VYVANSE) 70 MG capsule Take 1 capsule (70 mg total) by mouth daily. 30 capsule 0  . modafinil (PROVIGIL) 100 MG tablet Take 2 tablets (200 mg total) by mouth daily. 30 tablet 5  . Multiple Vitamin (MULTIVITAMIN) tablet Take 1 tablet by mouth daily.    Marland Kitchen omeprazole (PRILOSEC) 20 MG capsule Take 20 mg by mouth daily. Reported on 07/10/2015     No current facility-administered medications on file prior to visit.

## 2017-07-20 MED ORDER — LISDEXAMFETAMINE DIMESYLATE 40 MG PO CAPS: 40.0000 mg | ORAL_CAPSULE | ORAL | 0 refills | Status: DC

## 2017-07-20 MED ORDER — LISDEXAMFETAMINE DIMESYLATE 70 MG PO CAPS: 70.0000 mg | ORAL_CAPSULE | Freq: Every day | ORAL | 0 refills | Status: DC

## 2017-07-20 NOTE — Telephone Encounter (Signed)
Rx has been printed, signed and placed up front to be mailed. Pt is aware. Nothing further was needed.

## 2017-07-20 NOTE — Telephone Encounter (Signed)
Ok to refill both?? 

## 2017-07-30 ENCOUNTER — Telehealth: Payer: Self-pay | Admitting: Internal Medicine

## 2017-07-30 NOTE — Telephone Encounter (Signed)
I have faxed the pt's sleep study to the [facility]. Pt is aware. Nothing further was needed.

## 2017-08-04 ENCOUNTER — Telehealth: Payer: Self-pay | Admitting: Internal Medicine

## 2017-08-04 MED ORDER — LISDEXAMFETAMINE DIMESYLATE 40 MG PO CAPS: 40.0000 mg | ORAL_CAPSULE | ORAL | 0 refills | Status: DC

## 2017-08-04 MED ORDER — LISDEXAMFETAMINE DIMESYLATE 70 MG PO CAPS: 70.0000 mg | ORAL_CAPSULE | Freq: Every day | ORAL | 0 refills | Status: DC

## 2017-08-04 NOTE — Telephone Encounter (Signed)
Spoke with pt. States that she has not received her Vyvanse prescriptions. These were mailed to her on 07/20/17. She is requesting these again.  CY - please advise. Thanks.

## 2017-08-04 NOTE — Telephone Encounter (Signed)
Rx has been printed, signed and placed up front in the outgoing mail. Pt is aware and wants them to be mailed. Nothing further was needed.

## 2017-08-04 NOTE — Telephone Encounter (Signed)
Ok to re-write. Prefer she pick these up if they can.

## 2017-08-07 ENCOUNTER — Telehealth: Payer: Self-pay | Admitting: Internal Medicine

## 2017-08-07 NOTE — Telephone Encounter (Signed)
Called patient, unable to reach left message to give us a call back. 

## 2017-08-10 NOTE — Telephone Encounter (Signed)
Left message for patient stating she can have 1 rx filled and the other is no good-she can shred that. If other questions or concerns to please contact our office.

## 2017-08-14 ENCOUNTER — Ambulatory Visit: Payer: Medicare Other | Admitting: Internal Medicine

## 2017-09-17 ENCOUNTER — Ambulatory Visit: Payer: Medicare Other | Admitting: Internal Medicine

## 2017-10-14 ENCOUNTER — Telehealth: Payer: Self-pay | Admitting: Internal Medicine

## 2017-10-14 MED ORDER — LISDEXAMFETAMINE DIMESYLATE 70 MG PO CAPS: 70.0000 mg | ORAL_CAPSULE | Freq: Every day | ORAL | 0 refills | Status: DC

## 2017-10-14 MED ORDER — LISDEXAMFETAMINE DIMESYLATE 40 MG PO CAPS: 40.0000 mg | ORAL_CAPSULE | ORAL | 0 refills | Status: AC

## 2017-10-14 MED ORDER — LISDEXAMFETAMINE DIMESYLATE 40 MG PO CAPS: 40.0000 mg | ORAL_CAPSULE | ORAL | 0 refills | Status: DC

## 2017-10-14 NOTE — Telephone Encounter (Signed)
OK to refill both as before

## 2017-10-14 NOTE — Telephone Encounter (Signed)
Called and spoke to pt. Pt is requesting refill on Vyvanse both 40mg  and 70mg  to be mailed to address on file.  Last refilled on 08/04/17 #30. Pt last seen 05/15/17 with pending OV for 01/14/18.  CY please advise. Thanks  Current Outpatient Medications on File Prior to Visit  Medication Sig Dispense Refill  . albuterol (PROVENTIL HFA;VENTOLIN HFA) 108 (90 Base) MCG/ACT inhaler Inhale 2 puffs into the lungs every 4 (four) hours as needed. 18 Inhaler 12  . buPROPion (WELLBUTRIN XL) 300 MG 24 hr tablet Take 1 tablet (300 mg total) by mouth daily. 90 tablet 3  . lisdexamfetamine (VYVANSE) 40 MG capsule Take 1 capsule (40 mg total) by mouth every morning. 30 capsule 0  . lisdexamfetamine (VYVANSE) 70 MG capsule Take 1 capsule (70 mg total) by mouth daily. 30 capsule 0  . modafinil (PROVIGIL) 100 MG tablet Take 2 tablets (200 mg total) by mouth daily. 30 tablet 5  . Multiple Vitamin (MULTIVITAMIN) tablet Take 1 tablet by mouth daily.    Marland Kitchen omeprazole (PRILOSEC) 20 MG capsule Take 20 mg by mouth daily. Reported on 07/10/2015     No current facility-administered medications on file prior to visit.     Allergies  Allergen Reactions  . Desvenlafaxine     headaches Other reaction(s): Other headaches headaches

## 2017-10-14 NOTE — Addendum Note (Signed)
Addended by: Della Goo C on: 10/14/2017 01:57 PM   Modules accepted: Orders

## 2017-10-14 NOTE — Telephone Encounter (Signed)
Medications have been printed and signed. Placed in outgoing mail. Patient is aware. Nothing further needed.

## 2017-10-19 ENCOUNTER — Ambulatory Visit: Payer: Medicare HMO | Admitting: Internal Medicine

## 2017-11-05 ENCOUNTER — Telehealth: Payer: Self-pay | Admitting: Internal Medicine

## 2017-11-05 NOTE — Telephone Encounter (Signed)
PA was initiated for Vyvanse 70mg  capsules on [email or web address].com Vyvanse 70MG  capsules has been rejected by insurance.  Non-preferred agents typically have a higher patient co-pay than health insurance plan preferred agents. Please research and evaluate therapy options for this patient. You can learn more about alternative medications by calling [employer] NCPDP 2017 at [phone], or checking their online formulary. Download a PDF of this information to discuss alternative therapies with the prescriber.   Called [employer] NCPDP 2017 at phone [phone] today; spoke with Sam Checked on status on PA for Vyvanse 70mg  capsules This medication is rejected by [facility] at this time; in need to change medication  She advised to use another medication on the  Pt's formulary. She was unable to provide that list today.  LVM for patient today, in need of list of medications on her formulary  That is in the same family as VyVanse capsules.  Routing message to Joellen Jersey to f/u with patient and on PA status.

## 2017-11-06 NOTE — Telephone Encounter (Signed)
lmtcb x2 for pt. 

## 2017-11-13 NOTE — Telephone Encounter (Signed)
lmtcb X3 for pt. 

## 2017-11-19 NOTE — Telephone Encounter (Signed)
lmtcb X4 for pt. Letter sent to home address on file. Will close encounter per triage protocol.

## 2017-11-27 ENCOUNTER — Telehealth: Payer: Self-pay | Admitting: Internal Medicine

## 2017-11-27 NOTE — Telephone Encounter (Signed)
Attempted to call Patient.  VM is full and was unable to leave a message.

## 2017-12-01 ENCOUNTER — Telehealth: Payer: Self-pay | Admitting: Internal Medicine

## 2017-12-01 NOTE — Telephone Encounter (Signed)
Called and spoke with pt who stated the pharmacy was needing approval from [place] for her Vyvanse per insurance purposes.  Checked through pt's charts and saw that there was an encounter where a PA was initiated on 11/05/17 for pt's Vyvanse 70mg  capsules via covermymeds and it states that this had been rejected by insurance.  Non-preferred agents typically have a higher patient co-pay than health insurance plan preferred agents. Also saw that there was a note where [employer] had been contacted and the med had been rejected. Note stated to have pt obtain a formulary list so we can have Dr. Annamaria Boots review.  Called and spoke with pt stating this information to her. Pt stated she would contact her pharmacy to see how much the med would cost without insurance coverage and then go from there what needs to happen next. Nothing needed at this time.

## 2017-12-01 NOTE — Telephone Encounter (Signed)
Called Aleisha and there was no answer. LM for her to call back.

## 2017-12-01 NOTE — Telephone Encounter (Signed)
Patient is returning call, CB is [phone].

## 2017-12-01 NOTE — Telephone Encounter (Signed)
Attempted to call pt but no answer. Left message for pt to return call. 

## 2017-12-02 NOTE — Telephone Encounter (Signed)
Mary [name] from [place] is returning the call. CB is [phone].

## 2017-12-02 NOTE — Telephone Encounter (Signed)
Called [place], unable to reach left message to give [place] a call back.

## 2017-12-03 NOTE — Telephone Encounter (Signed)
Called and spoke with Aleisha from [facility] who is pt's agent that she works with. Lucky Rathke wanted to know why the Vyvanse was rejected and I stated to her that when we did a PA on the med, the med was rejected insurance coverage.  Lucky Rathke stated she would call pt to have her come in to her office so they could do an internal conversation call to see if they could get things worked out with pt's Vyvanse.  Nothing further needed.

## 2017-12-22 ENCOUNTER — Telehealth: Payer: Self-pay | Admitting: Internal Medicine

## 2017-12-23 NOTE — Telephone Encounter (Signed)
Pharmacy called stating they have not heard from our office regarding patient's PA. Pharmacy did not have our correct phone/fax number and address. All information has been updated and will wait for fax to come to my attention.

## 2017-12-24 NOTE — Telephone Encounter (Signed)
PA request information has been faxed back to [employer] today-will await a decision faxed or called to the office.

## 2017-12-24 NOTE — Telephone Encounter (Signed)
PA form was given to [facility] per her request. Routing encounter to [facility] for her to follow up on.

## 2017-12-28 NOTE — Telephone Encounter (Signed)
[place] and was told the patient's PA was denied for this medication. (Vyvanse 70mg  cap)  Dr. Annamaria Boots please advise  Allergies  Allergen Reactions  . Desvenlafaxine     headaches Other reaction(s): Other headaches headaches   Current Outpatient Medications on File Prior to Visit  Medication Sig Dispense Refill  . albuterol (PROVENTIL HFA;VENTOLIN HFA) 108 (90 Base) MCG/ACT inhaler Inhale 2 puffs into the lungs every 4 (four) hours as needed. 18 Inhaler 12  . buPROPion (WELLBUTRIN XL) 300 MG 24 hr tablet Take 1 tablet (300 mg total) by mouth daily. 90 tablet 3  . lisdexamfetamine (VYVANSE) 40 MG capsule Take 1 capsule (40 mg total) by mouth every morning. 30 capsule 0  . lisdexamfetamine (VYVANSE) 70 MG capsule Take 1 capsule (70 mg total) by mouth daily. 30 capsule 0  . modafinil (PROVIGIL) 100 MG tablet Take 2 tablets (200 mg total) by mouth daily. 30 tablet 5  . Multiple Vitamin (MULTIVITAMIN) tablet Take 1 tablet by mouth daily.    Marland Kitchen omeprazole (PRILOSEC) 20 MG capsule Take 20 mg by mouth daily. Reported on 07/10/2015     No current facility-administered medications on file prior to visit.

## 2017-12-28 NOTE — Telephone Encounter (Signed)
I called pt but the mailbox is full and unable to receive messages at this time.Will try again later

## 2017-12-28 NOTE — Telephone Encounter (Signed)
Have to let her know that insurance denied coverage of her Vyvanse in spite of our prior authorization request.  Does she remember using something else that helped her in the past ?

## 2017-12-29 NOTE — Telephone Encounter (Signed)
Pt is returning call. Cb is [phone].

## 2017-12-29 NOTE — Telephone Encounter (Signed)
Called and spoke with patient, she states her insurance was supposed to take care of it and send [place] paperwork stating that she was approved.  Patient is going to call her pharmacy and have them fax the paperwork over again.  Will leave open

## 2017-12-29 NOTE — Telephone Encounter (Signed)
Called patient, unable to reach and unable to leave message due to mailbox being full.

## 2017-12-30 NOTE — Telephone Encounter (Signed)
Insurance faxed denial/appeal info to office.  This has been given to [facility] for follow-up. Routing to [place] to follow up on.

## 2017-12-30 NOTE — Telephone Encounter (Signed)
CY Please advise if you would like to start appeal for patient. Denial information is in you inbasket. Thanks.

## 2017-12-30 NOTE — Telephone Encounter (Signed)
Called and spoke with patient, she stated that she called her [employer] and asked about this, she ha not heard back from them but once she does she will give [place] a call back. Will leave open.

## 2017-12-31 NOTE — Telephone Encounter (Signed)
Pt is returning call. Cb is [phone].

## 2017-12-31 NOTE — Telephone Encounter (Signed)
ATC pt, no answer. Left message for pt to call back.  

## 2018-01-01 NOTE — Telephone Encounter (Signed)
Call made to patient, patient states she received some paper work stating she was approved and she would be getting the Vynase in 2-3 business days. Patient also states she is aware Dr. Annamaria Boots will not be in the office on the 26th and she will call back to reschedule. Nothing further is needed at this time.

## 2018-01-14 ENCOUNTER — Ambulatory Visit: Payer: Medicare HMO | Admitting: Internal Medicine

## 2018-02-25 ENCOUNTER — Telehealth: Payer: Self-pay | Admitting: Internal Medicine

## 2018-02-25 ENCOUNTER — Encounter: Payer: Self-pay | Admitting: Internal Medicine

## 2018-02-25 NOTE — Telephone Encounter (Signed)
Letter printed ready to mail

## 2018-02-25 NOTE — Telephone Encounter (Signed)
Call received from Patient stating that she needed a PA for Vyvanse 40mg  and 70mg . LM for Patient to call back.   [facility] [phone], spoke with Maudie Mercury, who stated that 2 previous PA's had been denied for Vyvanse 40mg  and 70mg .  Appeal has 60 days to be initiated.  Was transferred to appeals, but only reached VM stating that appeals need to mailed to  [place] [address] [place] [address]  Called [phone] for [facility] appeals.  VM received stating written request need to be sent to above address.  Message routed to [facility] to follow up

## 2018-02-25 NOTE — Telephone Encounter (Signed)
Letter placed in mail 02/25/2018

## 2018-03-01 ENCOUNTER — Telehealth: Payer: Self-pay | Admitting: Internal Medicine

## 2018-03-01 NOTE — Telephone Encounter (Signed)
Call made to patient, made aware letter for appeal to [employer] has been mailed and we are awaiting a decision. Voiced understanding. Nothing further is needed at this time.

## 2018-03-05 ENCOUNTER — Telehealth: Payer: Self-pay | Admitting: Internal Medicine

## 2018-03-05 NOTE — Telephone Encounter (Signed)
Primary Pulmonologist: CY Last office visit and with whom: CY 05/15/17 What do we see them for (pulmonary problems): OSA and Narcolepsy Last OV assessment/plan:  Editor: Deneise Lever, MD (Physician)    Script printed for modafinil refill, increasing to 200 mg  Script sent refilling albuterol rescue inhaler  Order- unattended home sleep test   Dx OSA  Please call me about 2 weeks after the sleep test, for results and recommendations.,     Reason for call: patient is calling regarding Vyvanse 70mg  capsule for narcolepsy. She states this medication works best for her, and insurance is denying the medication after [place] appeal recently. Patient states she has found out that insurance will cover Vyvanse if this is listed for seizure medication but will not cover if listed for nacrolepsy.She was given # [phone] for Dr. Annamaria Boots to call as soon as possible for appeal over the phone.Patient has been without med for months, in need of this particular medication.  CY please advise. Thank you  Allergies  Allergen Reactions  . Desvenlafaxine     headaches Other reaction(s): Other headaches headaches   Current Outpatient Medications on File Prior to Visit  Medication Sig Dispense Refill  . albuterol (PROVENTIL HFA;VENTOLIN HFA) 108 (90 Base) MCG/ACT inhaler Inhale 2 puffs into the lungs every 4 (four) hours as needed. 18 Inhaler 12  . buPROPion (WELLBUTRIN XL) 300 MG 24 hr tablet Take 1 tablet (300 mg total) by mouth daily. 90 tablet 3  . lisdexamfetamine (VYVANSE) 40 MG capsule Take 1 capsule (40 mg total) by mouth every morning. 30 capsule 0  . lisdexamfetamine (VYVANSE) 70 MG capsule Take 1 capsule (70 mg total) by mouth daily. 30 capsule 0  . modafinil (PROVIGIL) 100 MG tablet Take 2 tablets (200 mg total) by mouth daily. 30 tablet 5  . Multiple Vitamin (MULTIVITAMIN) tablet Take 1 tablet by mouth daily.    Marland Kitchen omeprazole (PRILOSEC) 20 MG capsule Take 20 mg by mouth daily. Reported  on 07/10/2015     No current facility-administered medications on file prior to visit.

## 2018-03-07 NOTE — Telephone Encounter (Signed)
I do not treat seizures. If she wants to try to get that dose of Vyvanse for a seizure diagnosis she needs to see a neurologist. Ok to refer her to [facility] is she needs a neurologist and wants that referral.

## 2018-03-08 ENCOUNTER — Telehealth: Payer: Self-pay | Admitting: Internal Medicine

## 2018-03-08 NOTE — Telephone Encounter (Signed)
LMTCB

## 2018-03-08 NOTE — Telephone Encounter (Signed)
Called pt and advised message from the provider. Pt understood and verbalized understanding. Nothing further is needed. She is going to try to get an appeal.

## 2018-03-09 NOTE — Telephone Encounter (Signed)
lmom 

## 2018-03-10 NOTE — Telephone Encounter (Signed)
LMTCB

## 2018-03-10 NOTE — Telephone Encounter (Signed)
Patient is returning phone call.  Patient phone number is [phone].

## 2018-03-10 NOTE — Telephone Encounter (Signed)
Spoke with the pt  She is asking for samples of Vyvanse  I advised that we do not carry these samples here  She wants to ask Dr. Annamaria Boots if there is another drug similar to this that she can try since her insurance will not cover  Please advise thanks

## 2018-03-10 NOTE — Telephone Encounter (Signed)
Left message for patient to call back  

## 2018-03-10 NOTE — Telephone Encounter (Signed)
Per CY pt will ned to contact insurance to see which meds are covered and let us know-then CY can make a decision.

## 2018-03-11 NOTE — Telephone Encounter (Signed)
ATC Patient. Phone rang several times then went to busy signal x's 2.  Will try again at a later time.

## 2018-03-12 NOTE — Telephone Encounter (Signed)
Spoke with pt. She is aware that she will need to contact her insurance about the covered alternatives. Will await her call back.

## 2018-04-21 ENCOUNTER — Other Ambulatory Visit: Payer: Self-pay | Admitting: Internal Medicine

## 2018-04-22 NOTE — Telephone Encounter (Signed)
Bupropion (welbutrin) refill sent

## 2018-04-22 NOTE — Telephone Encounter (Signed)
CY please advised if it is ok to refill patients prescription.    Current Outpatient Medications on File Prior to Visit  Medication Sig Dispense Refill  . albuterol (PROVENTIL HFA;VENTOLIN HFA) 108 (90 Base) MCG/ACT inhaler Inhale 2 puffs into the lungs every 4 (four) hours as needed. 18 Inhaler 12  . buPROPion (WELLBUTRIN XL) 300 MG 24 hr tablet Take 1 tablet (300 mg total) by mouth daily. 90 tablet 3  . lisdexamfetamine (VYVANSE) 40 MG capsule Take 1 capsule (40 mg total) by mouth every morning. 30 capsule 0  . lisdexamfetamine (VYVANSE) 70 MG capsule Take 1 capsule (70 mg total) by mouth daily. 30 capsule 0  . modafinil (PROVIGIL) 100 MG tablet Take 2 tablets (200 mg total) by mouth daily. 30 tablet 5  . Multiple Vitamin (MULTIVITAMIN) tablet Take 1 tablet by mouth daily.    Marland Kitchen omeprazole (PRILOSEC) 20 MG capsule Take 20 mg by mouth daily. Reported on 07/10/2015     No current facility-administered medications on file prior to visit.

## 2018-10-06 ENCOUNTER — Other Ambulatory Visit: Payer: Self-pay

## 2018-10-06 DIAGNOSIS — Z20822 Contact with and (suspected) exposure to covid-19: Secondary | ICD-10-CM

## 2018-10-07 LAB — NOVEL CORONAVIRUS, NAA: SARS-CoV-2, NAA: NOT DETECTED

## 2019-01-07 ENCOUNTER — Other Ambulatory Visit: Payer: Self-pay | Admitting: Cardiology

## 2019-01-07 DIAGNOSIS — Z20822 Contact with and (suspected) exposure to covid-19: Secondary | ICD-10-CM

## 2019-01-09 LAB — NOVEL CORONAVIRUS, NAA: SARS-CoV-2, NAA: NOT DETECTED

## 2019-01-20 ENCOUNTER — Other Ambulatory Visit: Payer: Self-pay

## 2019-01-20 ENCOUNTER — Emergency Department (HOSPITAL_COMMUNITY): Payer: Medicare HMO

## 2019-01-20 ENCOUNTER — Emergency Department (HOSPITAL_COMMUNITY)
Admission: EM | Admit: 2019-01-20 | Discharge: 2019-01-20 | Disposition: A | Discharge status: Home / Self Care | Payer: Medicare HMO | Attending: Emergency Medicine | Admitting: Emergency Medicine

## 2019-01-20 ENCOUNTER — Encounter (HOSPITAL_COMMUNITY): Payer: Self-pay | Admitting: Emergency Medicine

## 2019-01-20 DIAGNOSIS — Z79899 Other long term (current) drug therapy: Secondary | ICD-10-CM | POA: Diagnosis not present

## 2019-01-20 DIAGNOSIS — R1084 Generalized abdominal pain: Secondary | ICD-10-CM | POA: Diagnosis not present

## 2019-01-20 LAB — URINALYSIS, ROUTINE W REFLEX MICROSCOPIC
Bilirubin Urine: NEGATIVE
Glucose, UA: NEGATIVE mg/dL
Hgb urine dipstick: NEGATIVE
Ketones, ur: NEGATIVE mg/dL
Leukocytes,Ua: NEGATIVE
Nitrite: NEGATIVE
Protein, ur: NEGATIVE mg/dL
Specific Gravity, Urine: 1.004 — ABNORMAL LOW (ref 1.005–1.030)
pH: 6 (ref 5.0–8.0)

## 2019-01-20 LAB — CBC
HCT: 44.2 % (ref 36.0–46.0)
Hemoglobin: 14.1 g/dL (ref 12.0–15.0)
MCH: 29.3 pg (ref 26.0–34.0)
MCHC: 31.9 g/dL (ref 30.0–36.0)
MCV: 91.7 fL (ref 80.0–100.0)
Platelets: 255 10*3/uL (ref 150–400)
RBC: 4.82 MIL/uL (ref 3.87–5.11)
RDW: 13.2 % (ref 11.5–15.5)
WBC: 5.9 10*3/uL (ref 4.0–10.5)
nRBC: 0 % (ref 0.0–0.2)

## 2019-01-20 LAB — COMPREHENSIVE METABOLIC PANEL
ALT: 29 U/L (ref 0–44)
AST: 27 U/L (ref 15–41)
Albumin: 3.3 g/dL — ABNORMAL LOW (ref 3.5–5.0)
Alkaline Phosphatase: 93 U/L (ref 38–126)
Anion gap: 7 (ref 5–15)
BUN: 6 mg/dL (ref 6–20)
CO2: 27 mmol/L (ref 22–32)
Calcium: 8.5 mg/dL — ABNORMAL LOW (ref 8.9–10.3)
Chloride: 110 mmol/L (ref 98–111)
Creatinine, Ser: 0.68 mg/dL (ref 0.44–1.00)
GFR calc Af Amer: >60 mL/min (ref >60)
GFR calc non Af Amer: >60 mL/min (ref >60)
Glucose, Bld: 82 mg/dL (ref 70–99)
Potassium: 4.3 mmol/L (ref 3.5–5.1)
Sodium: 144 mmol/L (ref 135–145)
Total Bilirubin: 0.3 mg/dL (ref 0.3–1.2)
Total Protein: 5.3 g/dL — ABNORMAL LOW (ref 6.5–8.1)

## 2019-01-20 LAB — LIPASE, BLOOD: Lipase: 23 U/L (ref 11–51)

## 2019-01-20 MED ORDER — SODIUM CHLORIDE 0.9 % IV BOLUS
1000.0000 mL | Freq: Once | INTRAVENOUS | Status: AC
  Administered 2019-01-20: 17:00:00 1000 mL via INTRAVENOUS

## 2019-01-20 MED ORDER — DICYCLOMINE HCL 20 MG PO TABS: 20.0000 mg | ORAL_TABLET | Freq: Two times a day (BID) | ORAL | 0 refills | Status: DC

## 2019-01-20 MED ORDER — HYDROMORPHONE HCL 1 MG/ML IJ SOLN
1.0000 mg | Freq: Once | INTRAMUSCULAR | Status: AC
  Administered 2019-01-20: 1 mg via INTRAVENOUS
  Filled 2019-01-20: qty 1

## 2019-01-20 MED ORDER — IOHEXOL 300 MG/ML  SOLN
100.0000 mL | Freq: Once | INTRAMUSCULAR | Status: AC | PRN
  Administered 2019-01-20: 100 mL via INTRAVENOUS

## 2019-01-20 MED ORDER — SODIUM CHLORIDE 0.9% FLUSH: 3.0000 mL | Freq: Once | INTRAVENOUS | Status: DC

## 2019-01-20 MED ORDER — ONDANSETRON HCL 4 MG/2ML IJ SOLN
4.0000 mg | Freq: Once | INTRAMUSCULAR | Status: AC
  Administered 2019-01-20: 17:00:00 4 mg via INTRAVENOUS
  Filled 2019-01-20: qty 2

## 2019-01-20 NOTE — ED Triage Notes (Signed)
Pt. Stated, I had a CT scan at [facility], ordered by [employer]. I have blockages in my small intestines.

## 2019-01-20 NOTE — ED Notes (Signed)
Discharge instructions discussed with pt. Pt verbalized understanding with no questions at this time.  

## 2019-01-20 NOTE — ED Provider Notes (Addendum)
[facility] EMERGENCY DEPARTMENT Provider Note   CSN: [id number] Arrival date & time: 01/20/19  1317     History Chief Complaint  Patient presents with  . Abdominal Pain    Mary [name] is a 48 y.o. 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 Heron Nay who presents to the ED today with gradual onset, constant, achy, diffuse abdominal pain > right side x 3 weeks with N/V/D.  Per chart review patient was seen by her PCP yesterday with complaint had a CT scan with findings below.   IMPRESSION:  At least 3 areas of short segment intussusception identified involving small bowel. No definitive associated mass lesion, however cannot exclude lead point. No bowel dilation to suggest obstruction. Many of these are transient..    Unremarkable appearance of the gallbladder by CT.    Status post gastric bypass.    5 mm subpleural left lower lobe pulmonary nodule    Excerpt from "Guidelines for Management of Incidental Pulmonary Nodules Detected on CT Images: From the Fleischner Society 2017" Radiology 2017; 000:1-16.     Note: Guidelines do not apply to lung cancer screening, patients with immunosuppression, or patients with known primary tumor.    Solid Single Nodule:  5 mm or smaller:   Low Risk: No routine follow-up  High Risk: Optional CT at 12 months    Electronically Signed by: Levy Sjogren     She was told to go to the emergency department immediately for further evaluation.  Patient was seen at [facility] ED yesterday with work-up of labs and consult to retrogastric surgeon Dr. Volanda Napoleon who looked at the images and thought that these were likely incidental changes from patient's gastric sleeve procedure. Pt discharged home after IVF, zofran, benadryl, and morphine and passed PO challenge with follow up with her surgeon Dr. Evorn Gong next week. Pt was prescribed zofran at ED visit but states she did not go pick it up as she went straight home  from her ED visit.   She states she returns today for a second opinion as she does not feel like they took her seriously at the ED in [place] yesterday. She reports she has been taking Tylenol without relief. She has not taken anything stronger at home but states that the morphine she was given in the ED yesterday did not do anything for her pain.   The history is provided by the patient and medical records.       Past Medical History:  Diagnosis Date  . Asthma   . Bicornuate uterus   . Depression   . Narcolepsy without cataplexy(347.00)    sleep studies 1999: AHI 4/hr, MSLT 4/2. 08/16/09 AHI 5.7/RDI 12.6  . Rhinitis     Patient Active Problem List   Diagnosis Date Noted  . Subchorionic hemorrhage in first trimester 01/14/2013  . Bicornuate uterus   . Allergic reaction 06/27/2011  . Family hx of colon cancer 04/16/2011  . Bilateral otitis media with effusion 03/25/2011  . Strep pharyngitis 03/25/2011  . Asthma in adult, mild intermittent, uncomplicated [id number]  . ATTENTION DEFICIT DISORDER, ADULT 07/03/2009  . UNSPECIFIED VITAMIN D DEFICIENCY 05/01/2009  . FATIGUE 05/01/2009  . MORBID OBESITY 11/21/2008  . DEPRESSIVE DISORDER 11/21/2008  . 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/21/2008    Past Surgical History:  Procedure Laterality Date  . CESAREAN SECTION    . MYRINGOTOMY  04/04/11  . TONSILLECTOMY    . UPPP for OSA  1999     OB History  Gravida  3   Para  1   Term      Preterm  1   AB  1   Living  1     SAB  1   TAB      Ectopic      Multiple      Live Births  1           Family History  Problem Relation Age of Onset  . Colon polyps Mother   . Colon cancer Maternal Aunt   . Colon cancer Maternal Grandmother   . Breast cancer Other   . Depression Other     Social History   Tobacco Use  . Smoking status: Never Smoker  . Smokeless tobacco: Never Used  Substance Use Topics  . Alcohol use: No  . Drug use: No    Home  Medications Prior to Admission medications   Medication Sig Start Date End Date Taking? Authorizing Provider  acetaminophen (TYLENOL) 500 MG tablet Take 1,000 mg by mouth every 6 (six) hours as needed for mild pain.   Yes [provider]  albuterol (PROVENTIL HFA;VENTOLIN HFA) 108 (90 Base) MCG/ACT inhaler Inhale 2 puffs into the lungs every 4 (four) hours as needed. 05/15/17  Yes Young, Tarri Fuller D, MD  buPROPion (WELLBUTRIN XL) 300 MG 24 hr tablet TAKE 1 TABLET BY MOUTH EVERY DAY Patient taking differently: Take 300 mg by mouth daily.  04/22/18  Yes Young, Tarri Fuller D, MD  FLUoxetine (PROZAC) 40 MG capsule Take 40 mg by mouth daily.  01/09/19  Yes [provider]  lisdexamfetamine (VYVANSE) 40 MG capsule Take 1 capsule (40 mg total) by mouth every morning. Patient taking differently: Take 40 mg by mouth daily. In the Afternoon 10/14/17  Yes Young, Clinton D, MD  lisdexamfetamine (VYVANSE) 70 MG capsule Take 1 capsule (70 mg total) by mouth daily. 10/14/17  Yes Young, Tarri Fuller D, MD  Multiple Vitamin (MULTIVITAMIN) tablet Take 1 tablet by mouth daily.   Yes [provider]  dicyclomine (BENTYL) 20 MG tablet Take 1 tablet (20 mg total) by mouth 2 (two) times daily. 01/20/19   Eustaquio Maize, PA-C    Allergies    Desvenlafaxine  Review of Systems   Review of Systems  Constitutional: Negative for chills and fever.  HENT: Negative for congestion.   Eyes: Negative for visual disturbance.  Respiratory: Negative for shortness of breath.   Cardiovascular: Negative for chest pain.  Gastrointestinal: Positive for abdominal pain, diarrhea, nausea and vomiting.  Genitourinary: Negative for difficulty urinating, dysuria, flank pain and frequency.  Musculoskeletal: Negative for myalgias.  Skin: Negative for rash.  Neurological: Negative for headaches.    Physical Exam Updated Vital Signs BP 115/70 (BP Location: Right Arm)   Pulse 71   Temp 98.1 F (36.7 C)   Resp (!) 6    LMP 11/29/2012   SpO2 98%   Physical Exam Vitals and nursing note reviewed.  Constitutional:      Appearance: She is not ill-appearing or diaphoretic.  HENT:     Head: Normocephalic and atraumatic.  Eyes:     Conjunctiva/sclera: Conjunctivae normal.  Cardiovascular:     Rate and Rhythm: Normal rate and regular rhythm.  Pulmonary:     Effort: Pulmonary effort is normal.     Breath sounds: Normal breath sounds.  Abdominal:     Palpations: Abdomen is soft.     Tenderness: There is generalized abdominal tenderness. There is no right CVA tenderness, left CVA tenderness,  guarding or rebound.     Comments: Soft, nondistended, + diffuse tenderness to palpation although worse in RLQ and RUQ, +BS throughout, no r/g/r, neg murphy's, neg mcburney's, no CVA TTP  Musculoskeletal:     Cervical back: Neck supple.  Skin:    General: Skin is warm and dry.  Neurological:     Mental Status: She is alert.     ED Results / Procedures / Treatments   Labs (all labs ordered are listed, but only abnormal results are displayed) Labs Reviewed  COMPREHENSIVE METABOLIC PANEL - Abnormal; Notable for the following components:      Result Value   Calcium 8.5 (*)    Total Protein 5.3 (*)    Albumin 3.3 (*)    All other components within normal limits  URINALYSIS, ROUTINE W REFLEX MICROSCOPIC - Abnormal; Notable for the following components:   Color, Urine STRAW (*)    Specific Gravity, Urine 1.004 (*)    All other components within normal limits  LIPASE, BLOOD  CBC    EKG None  Radiology No results found.  Procedures Procedures (including critical care time)  Medications Ordered in ED Medications  sodium chloride flush (NS) 0.9 % injection 3 mL (has no administration in time range)  sodium chloride 0.9 % bolus 1,000 mL (0 mLs Intravenous Stopped 01/20/19 1759)  ondansetron (ZOFRAN) injection 4 mg (4 mg Intravenous Given 01/20/19 1701)  HYDROmorphone (DILAUDID) injection 1 mg (1 mg  Intravenous Given 01/20/19 1702)  iohexol (OMNIPAQUE) 300 MG/ML solution 100 mL (100 mLs Intravenous Contrast Given 01/20/19 1909)    ED Course  I have reviewed the triage vital signs and the nursing notes.  Pertinent labs & imaging results that were available during my care of the patient were reviewed by me and considered in my medical decision making (see chart for details).  48 year old female who presents the ED today with continued diffuse although greater on right side abdominal pain for the past 3 weeks with nausea, vomiting, diarrhea.  CT scan done with findings as above.  Was told to go to the ED for further evaluation.  Per chart review patient was seen at [facility] ED yesterday with full work-up and consult to their retrogastric surgeon who thought that these findings were incidental from patient's previous gastric sleeve surgery done in August 2019.  She was discharged home.  States that she returns today for second opinion with continued abdominal pain.  Patient sitting upright comfortably on exam.  She does have tenderness diffusely to abdomen although there are no peritoneal signs.  Her tenderness does seem to be worse in the right upper quadrant and right lower quadrant.  She does have a history of gallstones but states she still has her gallbladder.  Lab work was obtained prior to being seen with no acute findings.  No cytosis.  Hemoglobin is stable.  There are no electrolyte derangements.  Creatinine within normal limits.  No increase in LFTs.  Lipase within normal limits.  Will treat symptomatically in the ED today with IV fluids, Zofran, pain medication.    Have discussed case with attending physician Dr. Sherry Ruffing who recommends that we consult our general surgeon see if they have any further commendations and if a repeat CT scan would be helpful for [place].   Clinical Course as of Jan 19 1909  Thu Jan 20, 2019  1707 Specific Gravity, Urine(!): 1.004 [MV]    Clinical Course User  Index [MV] Eustaquio Maize, [place]   Disccussed case with  Dr. Grandville Silos who thinks it is reasonable to get a repeat CT scan to see if there are any changes.  If not patient will need to follow-up with her surgeon as originally stated during her ED visit in [place] last week.  I have updated patient on this.  She is in agreement with plan.  CT scan without any acute findings. Will discharge home at this time. Have prescribed bentyl for pain. Pt advised to fill her zofran and take as needed. Follow up planned with Dr. Evorn Gong next week.   MDM Rules/Calculators/A&P                       Final Clinical Impression(s) / ED Diagnoses Final diagnoses:  Generalized abdominal pain    Rx / DC Orders ED Discharge Orders         Ordered    dicyclomine (BENTYL) 20 MG tablet  2 times daily     01/20/19 1910            Discharge Instructions     Please follow up with Dr. Evorn Gong in regards to your abdominal pain.  Continue taking your Zofran as needed. I have prescribed medication that will help with abdominal cramping - take as prescribed.        Eustaquio Maize, PA-C 01/20/19 1948    Tegeler, Gwenyth Allegra, MD 01/20/19 2120

## 2019-01-20 NOTE — Discharge Instructions (Addendum)
Please follow up with Dr. Evorn Gong in regards to your abdominal pain.  Continue taking your Zofran as needed. I have prescribed medication that will help with abdominal cramping - take as prescribed.

## 2019-01-20 NOTE — ED Notes (Signed)
Patient transported to CT 

## 2019-01-27 ENCOUNTER — Other Ambulatory Visit: Payer: Self-pay | Admitting: Internal Medicine

## 2019-09-30 ENCOUNTER — Telehealth: Payer: Self-pay | Admitting: Nurse Practitioner

## 2019-09-30 NOTE — Telephone Encounter (Signed)
Called to discuss with patient about Covid symptoms and the use of casirivimab/imdevimab, a monoclonal antibody infusion for those with mild to moderate Covid symptoms and at a high risk of hospitalization.  Pt is qualified for this infusion at the [facility] due to; Specific high risk criteria : Chronic Lung Disease   Message left to call back our hotline [phone].  Walden Field, NP [facility]

## 2019-10-01 ENCOUNTER — Telehealth: Payer: Self-pay | Admitting: Oncology

## 2019-10-01 NOTE — Telephone Encounter (Signed)
Called to Discuss with patient about Covid symptoms and the use of regeneron, a monoclonal antibody infusion for those with mild to moderate Covid symptoms and at a high risk of hospitalization.     Pt is qualified for this infusion at the [facility] infusion center due to co-morbid conditions and/or a member of an at-risk group.    Past Medical History:  Diagnosis Date  . Asthma   . Bicornuate uterus   . Depression   . Narcolepsy without cataplexy(347.00)    sleep studies 1999: AHI 4/hr, MSLT 4/2. 08/16/09 AHI 5.7/RDI 12.6  . Rhinitis    Unable to reach pt. Left message to return call  Rulon Abide AGNP-C [phone] [facility])

## 2020-01-03 ENCOUNTER — Other Ambulatory Visit: Payer: Self-pay | Admitting: Internal Medicine

## 2021-08-04 IMAGING — CT CT ABD-PELV W/ CM
2 of 5 series · 17 of 46 positions shown, 19 images · IV contrast (Omni 300)
Comparison: None.

CLINICAL DATA: Acute generalized abdominal pain.

EXAM:
CT ABDOMEN AND PELVIS WITH CONTRAST
TECHNIQUE: Multidetector CT imaging of the abdomen and pelvis was performed
using the standard protocol following bolus administration of
intravenous contrast.
CONTRAST:  100mL OMNIPAQUE IOHEXOL 300 MG/ML  SOLN

[Series 3: a/p w/ 5mm · axial · 0.89mm/px · z∈[+709,+1119]mm · 14 of 94 slices shown, 16 images]
[im 6/94  soft-tissue]
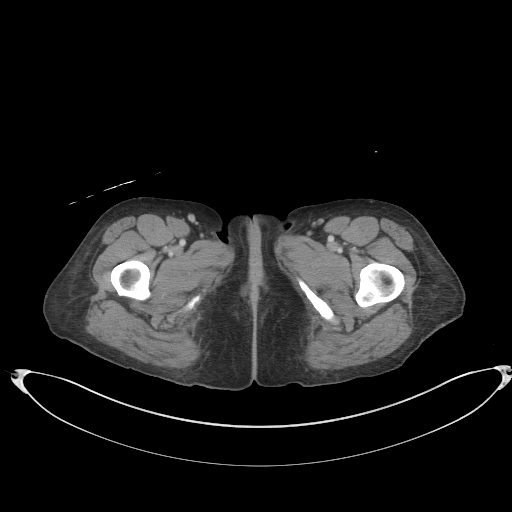
[im 6/94  bone]
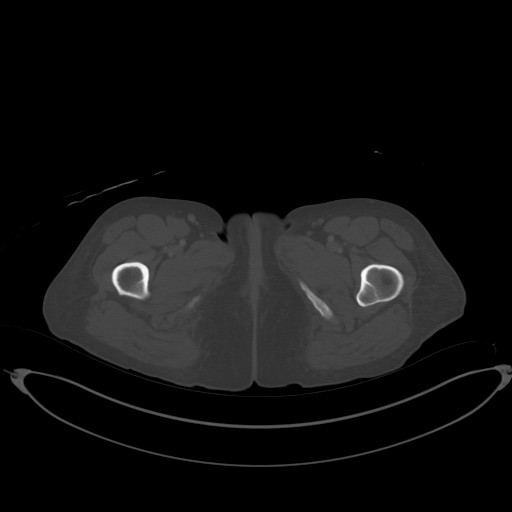
[im 11/94  soft-tissue]
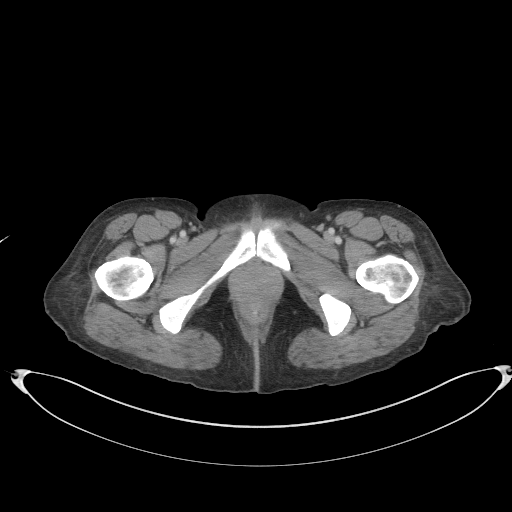
[im 21/94  soft-tissue]
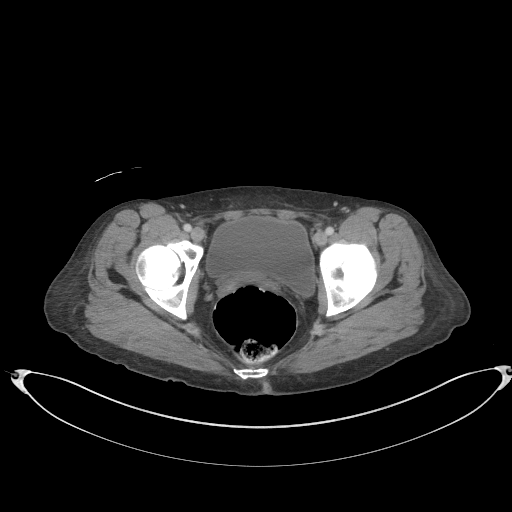
[im 26/94  soft-tissue]
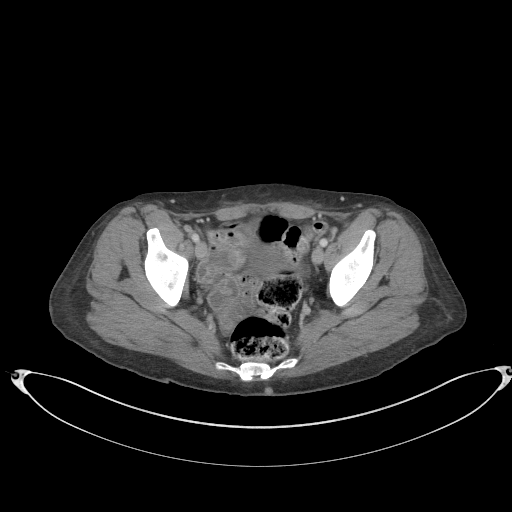
[im 32/94  soft-tissue]
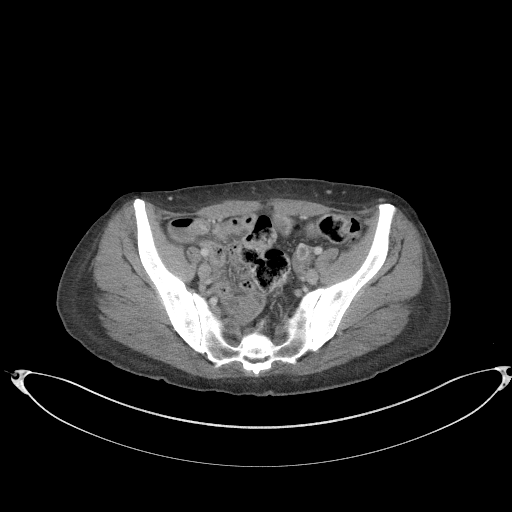
[im 37/94  soft-tissue]
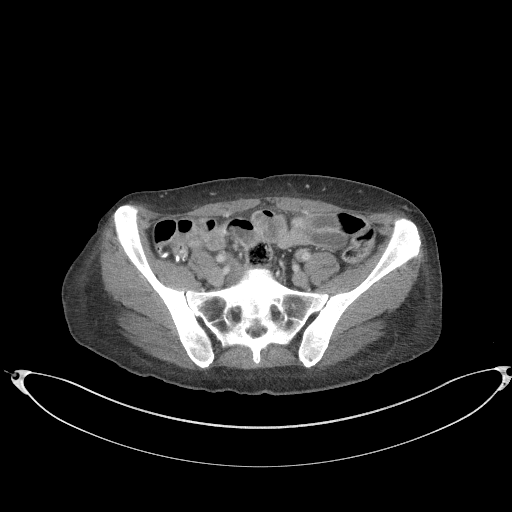
[im 42/94  soft-tissue]
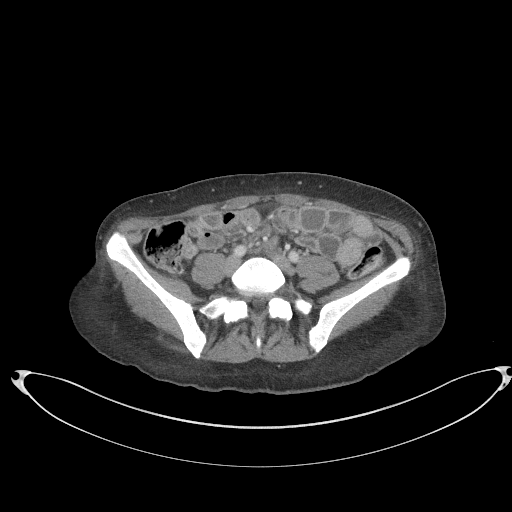
[im 52/94  soft-tissue]
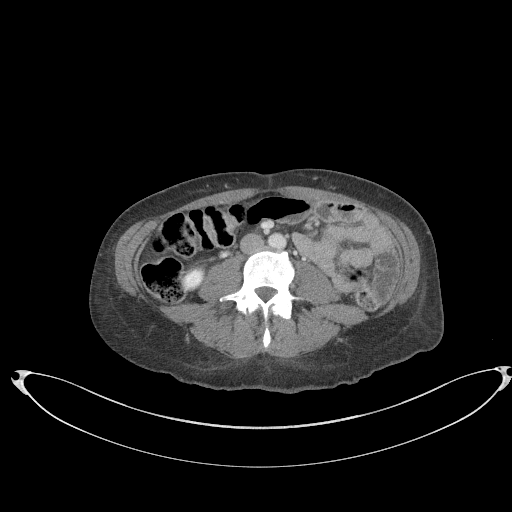
[im 57/94  soft-tissue]
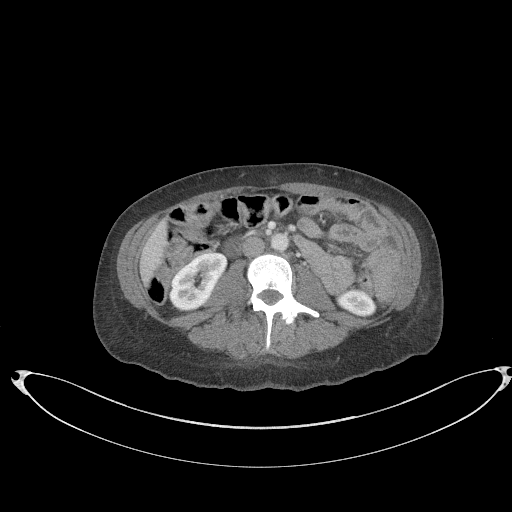
[im 57/94  bone]
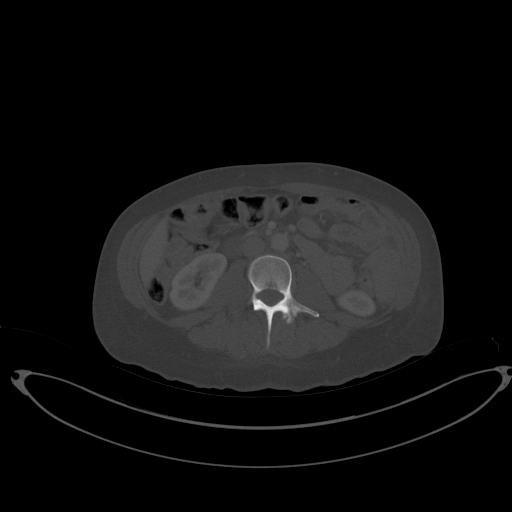
[im 63/94  soft-tissue]
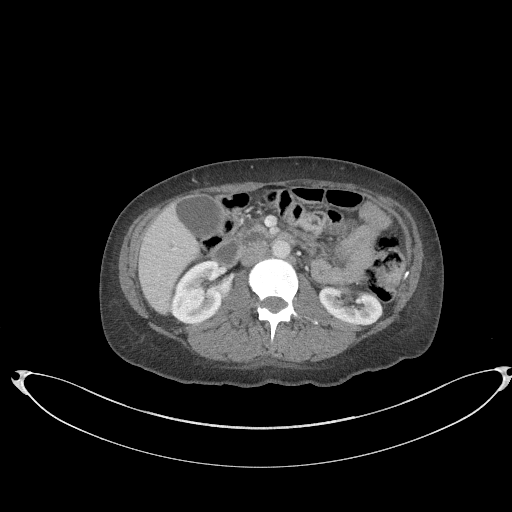
[im 68/94  soft-tissue]
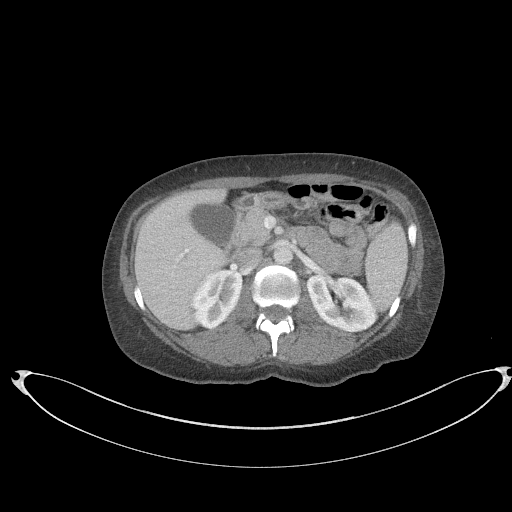
[im 73/94  soft-tissue]
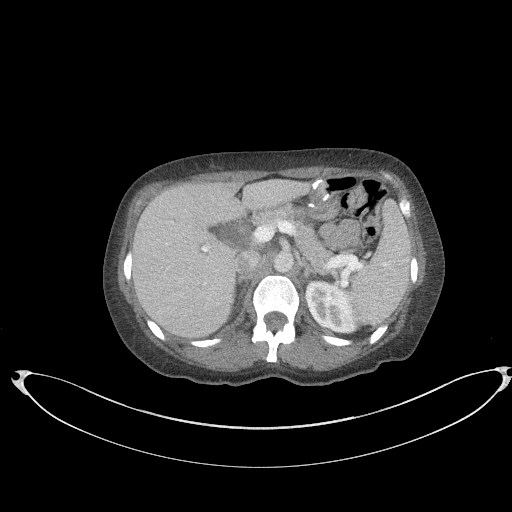
[im 83/94  soft-tissue]
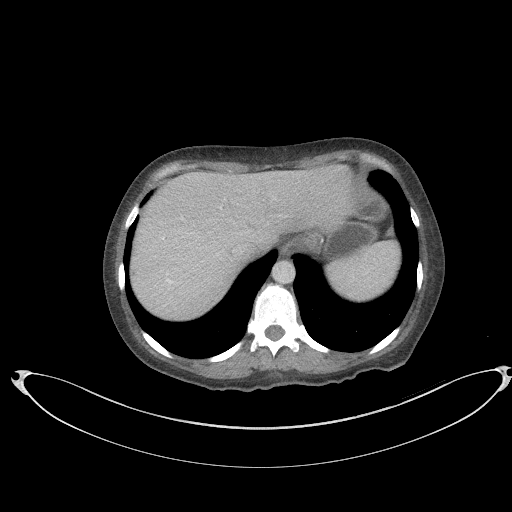
[im 88/94  soft-tissue]
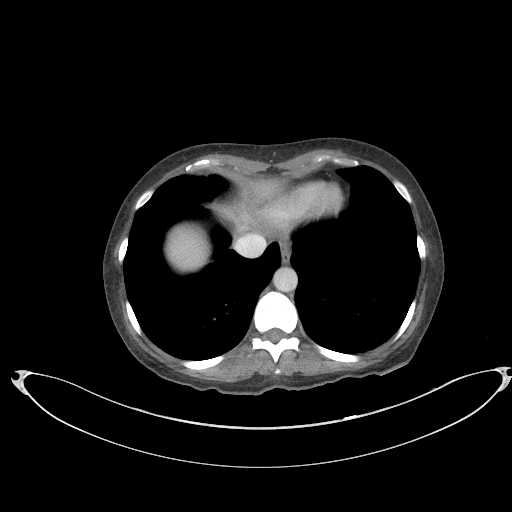

[Series 6: a/p w/ cor · coronal · 0.82mm/px · 3 of 138 slices shown]
[im 46/138  soft-tissue]
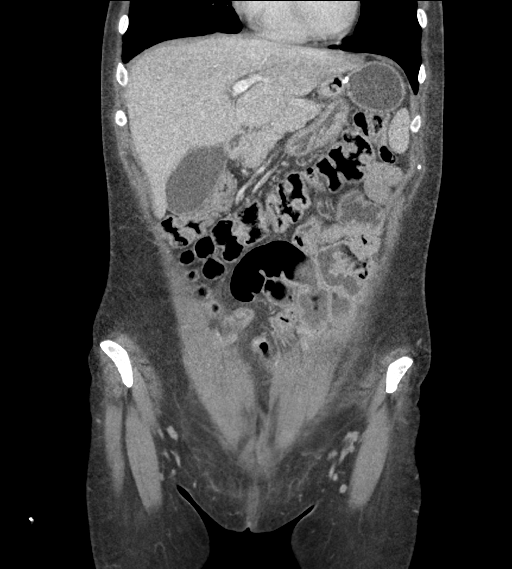
[im 61/138  soft-tissue]
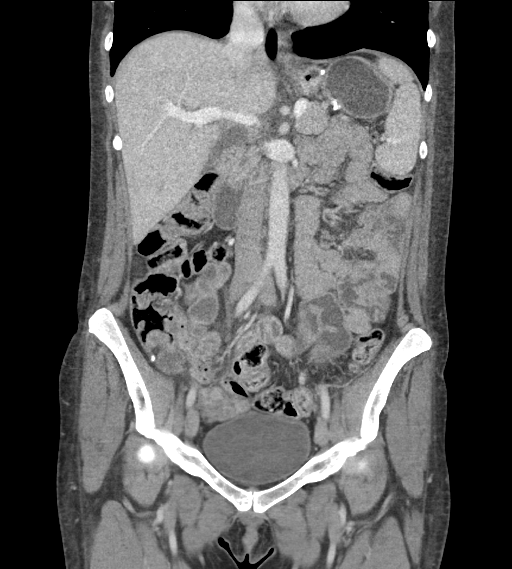
[im 77/138  soft-tissue]
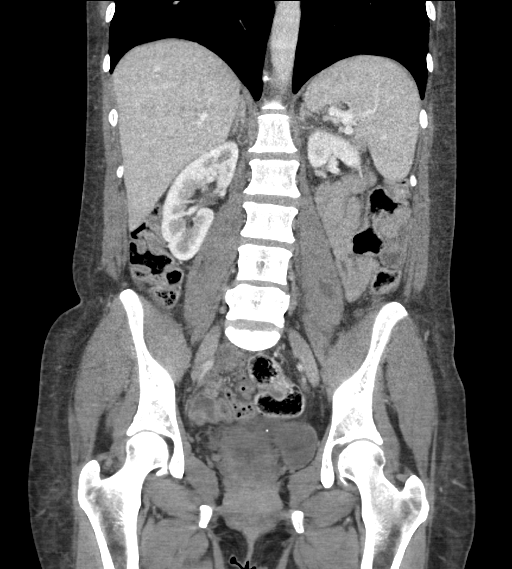

[17 of 46 positions shown; findings below may reference images not displayed]

FINDINGS: Lower chest: No acute abnormality.

Hepatobiliary: No focal liver abnormality is seen. No gallstones,
gallbladder wall thickening, or biliary dilatation.

Pancreas: Unremarkable. No pancreatic ductal dilatation or
surrounding inflammatory changes.

Spleen: Normal in size without focal abnormality.

Adrenals/Urinary Tract: Adrenal glands are unremarkable. Kidneys are
normal, without renal calculi, focal lesion, or hydronephrosis.
Bladder is unremarkable.

Stomach/Bowel: Status post gastric surgery. There is no evidence of
bowel obstruction or inflammation. The appendix is unremarkable.

Vascular/Lymphatic: No significant vascular findings are present. No
enlarged abdominal or pelvic lymph nodes.

Reproductive: Uterus is not clearly visualized and clinical
correlation is recommended to determine if the patient is undergone
hysterectomy. No definite adnexal abnormality is noted.

Other: No abdominal wall hernia or abnormality. No abdominopelvic
ascites.

Musculoskeletal: No acute or significant osseous findings.
IMPRESSION: Status post gastric surgery. No acute abnormality seen in the
abdomen or pelvis.

## 2021-12-10 ENCOUNTER — Encounter: Payer: Self-pay | Admitting: Neurology

## 2021-12-10 ENCOUNTER — Ambulatory Visit: Payer: PPO | Admitting: Neurology

## 2021-12-10 VITALS — BP 104/61 | HR 74 | Ht 68.0 in | Wt 134.4 lb

## 2021-12-10 DIAGNOSIS — G43809 Other migraine, not intractable, without status migrainosus: Secondary | ICD-10-CM | POA: Diagnosis not present

## 2021-12-10 DIAGNOSIS — G43709 Chronic migraine without aura, not intractable, without status migrainosus: Secondary | ICD-10-CM

## 2021-12-10 MED ORDER — KETOROLAC TROMETHAMINE 60 MG/2ML IM SOLN
60.0000 mg | Freq: Once | INTRAMUSCULAR | Status: AC
  Administered 2021-12-10: 60 mg via INTRAMUSCULAR

## 2021-12-10 NOTE — Progress Notes (Signed)
Per vo from Dr Jaynee Eagles, injected Toradol 60 mg IM x 1 in RUOQ of R buttock under aseptic technique. Bandaid applied. Pt tolerated well. See MAR.

## 2021-12-10 NOTE — Progress Notes (Signed)
[facility]    Provider:  Dr Jaynee Eagles Requesting Provider: Mindi Curling, PA-C Primary Care Provider:  Mindi Curling, PA-C  CC:  Migraines  HPI:  Mary [name] is a 51 y.o. female here as requested by Mindi Curling, PA-C for migraines.PMHx asthma, bilateral otitis media, hx og gastric bypass, vitamin D deficiency, narcolepsy, morbid obesity, fatigue, depressive disorder, OSA, excessive sleepiness, white matter changes.  I reviewed Dr. Mallie Snooks notes, family has a family history of strokes, seen at [facility] ED 10/09/2020 for possible stroke due to visual disturbance, negative TIA diagnosed with ocular migraines, concerned about what was seen in MRI at that time white matter hyperintensities, she has elevated blood pressure, she is also concerned due to dad's memory issues she feels at times she does have some changes in her memory word finding walking into her room and forgetting why came in.  She is usually give her supplementing will, but that but is started to notice it with people's names, started about a year ago, no balance gait does have migraine history but these have been fairly well controlled.  MRI of the brain was abnormal with white matter changes.  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  Today she is having a migraine, she just took her Roselyn Meier, we will going to give her a Toradol shot and give her some time to see if we can get her over the migraine, very difficult to conduct appointment when she is in so much pain.  She had a comprehensive serum exam including zinc, vitamin B12, Vitamin D, iron panel, TSH, CMP, CBC which was unremarkable per patient..  She  also has anxiety and insomnia..  She also has migraines weekly, she feels sick for migraines as well, severe migraine about once a week the last 2 to 3 days.  Patient is here alone and she seems to be in distress.  Patient is here alone and appears to be in distress.  She reports she has migraines weekly, at least once a week the last 2 to 3 days, when she has a severe migraine just laying in bed into the migraine resolves, she is currently prescribed Emgality monthly and Ubrelvy abortive, she gets about 15 migraine days a month that are moderate to severe lasting 24 hours, no aura, no medication overuse, she has been trying to watch her diet, eat more protein, watch her sleep hygiene, she constantly feels fatigued.daily headaches.No aura. No medication overuse.  No other focal neurologic deficits, associated symptoms, inciting events or modifiable factors.   Reviewed notes, labs and imaging from outside physicians, which showed:   Medications tried that can be used in migraine management include Tylenol, Ubrelvy, amitriptyline, Celexa, Pristiq, Prozac, Depo-Medrol, nifedipine (calcium channel blocker similar to verapamil), ondansetron, oxycodone, prednisone, beta-blockers, tricyclic antidepressants, Topamax.,  Flexeril, naproxen,  CT head 05/16/2021: IMPRESSION: No acute intracranial abnormality.  Electronically Signed by: Carrington Clamp on 05/16/2021 10:39 AM Narrative  INDICATION:    Headache, chronic, new features or increased frequency double vision,  COMPARISON:   None.  TECHNIQUE:    Multiple axial images obtained from the skull base to the vertex without IV contrast  were obtained on  05/16/2021 9:40 AM  FINDINGS:  No evidence of hydrocephalus. No  intracranial mass, mass effect or midline shift. No acute infarction evident. No intracranial hemorrhage. Paranasal sinuses: Clear. Mastoid air cells: Clear. Calvarium: Intact. Procedure Note  Carrington Clamp, MD -  05/16/2021 Formatting of this note might be different from the original. INDICATION:    Headache, chronic, new features or increased frequency double vision,  COMPARISON:   None.  TECHNIQUE:    Multiple axial images obtained from the skull base to the vertex without IV contrast  were obtained on  05/16/2021 9:40 AM  FINDINGS:  No evidence of hydrocephalus. No intracranial mass, mass effect or midline shift. No acute infarction evident. No intracranial hemorrhage. Paranasal sinuses: Clear. Mastoid air cells: Clear. Calvarium: Intact.   IMPRESSION: No acute intracranial abnormality.     Latest Ref Rng & Units 01/20/2019    2:31 PM 05/01/2009   10:31 AM 01/06/2008    4:55 AM  CMP  Glucose 70 - 99 mg/dL 82  77  93   BUN 6 - 20 mg/dL '6  6  8   '$ Creatinine 0.44 - 1.00 mg/dL 0.68  0.7  0.53   Sodium 135 - 145 mmol/L 144  145  139   Potassium 3.5 - 5.1 mmol/L 4.3  3.9  3.8   Chloride 98 - 111 mmol/L 110  107  108   CO2 22 - 32 mmol/L '27  29  23   '$ Calcium 8.9 - 10.3 mg/dL 8.5  8.7  8.3   Total Protein 6.5 - 8.1 g/dL 5.3  6.2  4.9   Total Bilirubin 0.3 - 1.2 mg/dL 0.3  0.3  0.4   Alkaline Phos 38 - 126 U/L 93  68  79   AST 15 - 41 U/L '27  20  27   '$ ALT 0 - 44 U/L '29  22  28      '$ CT head 2021:  Narrative  FINDINGS CLINICAL INFORMATION:  51 YEAR OLD FOUND UNRESPONSIVE EARLIER, NOW WITH ALTERED MENTAL STATUS. CRANIAL CT WITHOUT CONTRAST, 07/11/99: TECHNIQUE: 5 MM AXIAL IMAGES WERE OBTAINED FROM THE SKULL BASE TO THE POSTERIOR FOSSA FOLLOWED BY 10 MM AXIAL IMAGES THROUGH THE REMAINDER OF THE BRAIN TO THE VERTEX. FINDINGS:  PATIENT MOTION IS PRESENT DURING THE EXAMINATION WHICH DEGRADES SOME OF THE IMAGES; THE EXAMINATION DOES APPEAR DIAGNOSTIC, HOWEVER. THE VENTRICULAR SYSTEM IS NORMAL IN SIZE AND APPEARANCE FOR AGE.  THERE IS NO MASS EFFECT OR MIDLINE SHIFT.  THERE IS NO HEMORRHAGE OR HEMATOMA.  NO EXTRA-AXIAL FLUID COLLECTIONS ARE IDENTIFIED.  THERE ARE NO FOCAL BRAIN  PARENCHYMAL ABNORMALITIES.  BONE WINDOW IMAGES DEMONSTRATE NO SKULL FRACTURES.  THE VISUALIZED PARANASAL SINUSES AND MASTOID AIR CELLS APPEAR WELL AERATED. IMPRESSION NORMAL UNENHANCED CRANIAL CT.     Review of Systems: Patient complains of symptoms per HPI as well as the following symptoms: intractable headache. Pertinent negatives and positives per HPI. All others negative.   Social History   Socioeconomic History   Marital status: Married    Spouse name: Not on file   Number of children: Not on file   Years of education: Not on file   Highest education level: Not on file  Occupational History   Not on file  Tobacco Use   Smoking status: Never   Smokeless tobacco: Never  Vaping Use   Vaping Use: Never used  Substance and Sexual Activity   Alcohol use: No   Drug use: No   Sexual activity: Yes    Birth control/protection: None  Other Topics Concern   Not on  file  Social History Narrative   Caffiene once in while.    Tea rare.   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   Social Determinants of Health   Financial Resource Strain: Not on file  Food Insecurity: Not on file  Transportation Needs: Not on file  Physical Activity: Not on file  Stress: Not on file  Social Connections: Not on file  Intimate Partner Violence: Not on file    Family History  Problem Relation Age of Onset   Colon polyps Mother    ALS Mother        passed 45 yrs ago   Migraines Father    Migraines Sister    Colon cancer Maternal Grandmother    Colon cancer Maternal Aunt    Breast cancer Other    Depression Other     Past Medical History:  Diagnosis Date   Asthma    Bicornuate uterus    Depression    Narcolepsy without cataplexy(347.00)    sleep studies 1999: AHI 4/hr, MSLT 4/2. 08/16/09 AHI 5.7/RDI 12.6   Rhinitis     Patient Active Problem List   Diagnosis Date Noted   Chronic migraine without aura without status migrainosus, not intractable 12/15/2021    Subchorionic hemorrhage in first trimester 01/14/2013   Bicornuate uterus    Allergic reaction 06/27/2011   Family hx of colon cancer 04/16/2011   Bilateral otitis media with effusion 03/25/2011   Strep pharyngitis 03/25/2011   Asthma in adult, mild intermittent, uncomplicated 16/10/9602   ATTENTION DEFICIT DISORDER, ADULT 07/03/2009   UNSPECIFIED VITAMIN D DEFICIENCY 05/01/2009   FATIGUE 05/01/2009   MORBID OBESITY 11/21/2008   DEPRESSIVE DISORDER 11/21/2008   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     albuterol (PROVENTIL HFA;VENTOLIN HFA) 108 (90 Base) MCG/ACT inhaler Inhale 2 puffs into the lungs every 4 (four) hours as needed. 18 Inhaler 12   buPROPion (WELLBUTRIN XL) 300 MG 24 hr tablet TAKE 1 TABLET BY MOUTH EVERY DAY (Patient taking differently: Take 300 mg by mouth daily.) 90 tablet 3   dicyclomine (BENTYL) 20 MG tablet Take 1 tablet (20 mg total) by mouth 2 (two) times daily. 20 tablet 0   FLUoxetine (PROZAC) 40 MG capsule Take 40 mg by mouth daily.      lisdexamfetamine (VYVANSE) 40 MG capsule Take 1 capsule (40 mg total) by mouth every morning. (Patient taking differently: Take 40 mg by mouth daily. In the Afternoon) 30 capsule 0   lisdexamfetamine (VYVANSE) 70 MG capsule Take 1 capsule (70 mg total) by mouth daily. 30 capsule 0   Multiple Vitamin (MULTIVITAMIN) tablet Take 1 tablet by mouth daily.     No current facility-administered medications for this visit.    Allergies as of 12/10/2021 - Review Complete 12/10/2021  Allergen Reaction Noted   Desvenlafaxine  03/10/2012    Vitals: BP 104/61   Pulse 74   Ht '5\' 8"'$   (1.727 m)   Wt 134 lb 6.4 oz (61 kg)   LMP 11/29/2012   BMI 20.44 kg/m  Last Weight:  Wt Readings  from Last 1 Encounters:  12/10/21 134 lb 6.4 oz (61 kg)   Last Height:   Ht Readings from Last 1 Encounters:  12/10/21 '5\' 8"'$  (1.727 m)     Physical exam: Exam: Gen: in distress, conversant, well nourised, well groomed                     CV: RRR, no MRG. No Carotid Bruits. No peripheral edema, warm, nontender Eyes: Conjunctivae clear without exudates or hemorrhage  Neuro: Detailed Neurologic Exam  Speech:    Speech is normal; fluent and spontaneous with normal comprehension.  Cognition:      The patient is oriented to person, place, and time;     recent and remote memory intact;     language fluent;     normal attention, concentration,     fund of knowledge Cranial Nerves:    The pupils are equal, round, and reactive to light. Could not visualize fundi due to photophobia, attempted. Marland Kitchen Extraocular movements are intact. Trigeminal sensation is intact and the muscles of mastication are normal. The face is symmetric. The palate elevates in the midline. Hearing intact. Voice is normal. Shoulder shrug is normal. The tongue has normal motion without fasciculations.   Coordination:    Normal f   Gait: normal.   Motor Observation:    No asymmetry, no atrophy, and no involuntary movements noted. Tone:    Normal muscle tone.    Posture:    Posture is normal. normal erect    Strength:    Strength is V/V in the upper and lower limbs.      Sensation: intact to LT     Reflex Exam:  DTR's:    Deep tendon reflexes in the upper and lower extremities are symmetrical bilaterally.   Toes:    The toes are downgoing bilaterally.   Clonus:    Clonus is absent.    Assessment/Plan:  patient with chronic migraines has failed multiple medications will start botox protocol approval Stop emgality. 15 migraine days a month that are moderate to severe lasting 24 hours, no aura, no  medication overuse. Daily headaches ongoing > 1 year.   IV migraine cocktail today  Medications tried that can be used in migraine management include Tylenol, Ubrelvy, amitriptyline, Celexa, Pristiq, Prozac, Depo-Medrol, nifedipine (calcium channel blocker similar to verapamil), ondansetron, oxycodone, prednisone, beta-blockers(propranolol containdicated due to hypotension), tricyclic antidepressants amitriptyline, Topamax.,  Flexeril, naproxen, emgality  Discussed: To prevent or relieve headaches, try the following: Cool Compress. Lie down and place a cool compress on your head.  Avoid headache triggers. If certain foods or odors seem to have triggered your migraines in the past, avoid them. A headache diary might help you identify triggers.  Include physical activity in your daily routine. Try a daily walk or other moderate aerobic exercise.  Manage stress. Find healthy ways to cope with the stressors, such as delegating tasks on your to-do list.  Practice relaxation techniques. Try deep breathing, yoga, massage and visualization.  Eat regularly. Eating regularly scheduled meals and maintaining a healthy diet might help prevent headaches. Also, drink plenty of fluids.  Follow a regular sleep schedule. Sleep deprivation might contribute to headaches Consider biofeedback. With this mind-body technique, you learn to control certain bodily functions -- such as muscle tension, heart rate and blood pressure -- to prevent headaches or reduce headache pain.    Proceed to emergency room if you experience new or worsening symptoms or symptoms do not resolve, if  you have new neurologic symptoms or if headache is severe, or for any concerning symptom.   Provided education and documentation from American headache Society toolbox including articles on: chronic migraine medication overuse headache, chronic migraines, prevention of migraines, behavioral and other nonpharmacologic treatments for  headache.   No orders of the defined types were placed in this encounter.  Meds ordered this encounter  Medications   ketorolac (TORADOL) injection 60 mg    Cc: Lamount Cranker, PA-C  Sarina Ill, MD  [facility] [address] [place], [place] [address]  Phone [phone] Fax [phone]  I spent over 60 minutes of face-to-face and non-face-to-face time with patient on the  1. Chronic migraine without aura without status migrainosus, not intractable    diagnosis.  This included previsit chart review, lab review, study review, order entry, electronic health record documentation, patient education on the different diagnostic and therapeutic options, counseling and coordination of care, risks and benefits of management, compliance, or risk factor reduction

## 2021-12-15 ENCOUNTER — Telehealth: Payer: Self-pay | Admitting: Neurology

## 2021-12-15 ENCOUNTER — Encounter: Payer: Self-pay | Admitting: Neurology

## 2021-12-15 DIAGNOSIS — G43709 Chronic migraine without aura, not intractable, without status migrainosus: Secondary | ICD-10-CM | POA: Insufficient documentation

## 2021-12-15 NOTE — Telephone Encounter (Signed)
Please start approval for botox for migraines

## 2021-12-16 ENCOUNTER — Other Ambulatory Visit (HOSPITAL_COMMUNITY): Payer: Self-pay

## 2021-12-16 MED ORDER — BOTOX 200 UNITS IJ SOLR
INTRAMUSCULAR | 1 refills | Status: DC
  Filled 2021-12-16 – 2022-04-15 (×2): qty 1, 84d supply, fill #0
  Filled 2022-07-03: qty 1, 84d supply, fill #1

## 2021-12-16 NOTE — Telephone Encounter (Signed)
Chronic Migraine CPT 64615  Botox [id number] Units: 200  G43.709 Chronic Migraine without aura, not intractable, without status migrainous  Please schedule patient w/ an NP

## 2021-12-16 NOTE — Addendum Note (Signed)
Addended by: Gildardo Griffes on: 12/16/2021 07:45 AM   Modules accepted: Orders

## 2021-12-17 ENCOUNTER — Other Ambulatory Visit (HOSPITAL_COMMUNITY): Payer: Self-pay

## 2021-12-17 NOTE — Telephone Encounter (Signed)
Patient Advocate Encounter   Received notification that prior authorization for Botox 200UNIT solution is required.   PA submitted on 12/17/2021 Key [id number] Status is pending       Lyndel Safe, [place] Patient Advocate Specialist [place] Patient Advocate Team Direct Number: [phone]  Fax: [phone]

## 2021-12-18 ENCOUNTER — Other Ambulatory Visit (HOSPITAL_COMMUNITY): Payer: Self-pay

## 2021-12-18 NOTE — Telephone Encounter (Signed)
Patient Advocate Encounter  Prior Authorization for Botox 200UNIT solution has been approved.    PA# [id number] Effective dates: 12/17/2021 through 01/20/2023  Can Be filled at [place], [place] Patient Advocate Specialist [place] Patient Advocate Team Direct Number: [phone]  Fax: [phone]

## 2021-12-18 NOTE — Telephone Encounter (Signed)
LVM asking pt to call back and schedule Botox. Please see below instructions for scheduling if she returns call.

## 2021-12-20 ENCOUNTER — Encounter: Payer: Self-pay | Admitting: Adult Health

## 2021-12-20 NOTE — Telephone Encounter (Signed)
Pt has returned phone call and has been scheduled with NP.

## 2021-12-26 ENCOUNTER — Ambulatory Visit: Payer: PPO | Admitting: Neurology

## 2022-01-09 NOTE — Progress Notes (Signed)
Update 01/14/2022 JM: Patient is being seen for initial Botox injection.  Reports currently experiencing 8-12 migraine days per month. Stopped Emgality since prior visit, denies any worsening of migraines. Does have some cervicalgia, referral placed to PT for dry needling as this could be contributing to migraine headaches.  Tolerated procedure well today.  Will follow-up in 3 months for repeat injection.    Consent Form Botulism Toxin Injection For Chronic Migraine    Reviewed orally with patient, additionally signature is on file:  Botulism toxin has been approved by the Federal drug administration for treatment of chronic migraine. Botulism toxin does not cure chronic migraine and it may not be effective in some patients.  The administration of botulism toxin is accomplished by injecting a small amount of toxin into the muscles of the neck and head. Dosage must be titrated for each individual. Any benefits resulting from botulism toxin tend to wear off after 3 months with a repeat injection required if benefit is to be maintained. Injections are usually done every 3-4 months with maximum effect peak achieved by about 2 or 3 weeks. Botulism toxin is expensive and you should be sure of what costs you will incur resulting from the injection.  The side effects of botulism toxin use for chronic migraine may include:   -Transient, and usually mild, facial weakness with facial injections  -Transient, and usually mild, head or neck weakness with head/neck injections  -Reduction or loss of forehead facial animation due to forehead muscle weakness  -Eyelid drooping  -Dry eye  -Pain at the site of injection or bruising at the site of injection  -Double vision  -Potential unknown long term risks   Contraindications: You should not have Botox if you are pregnant, nursing, allergic to albumin, have an infection, skin condition, or muscle weakness at the site of the injection, or have myasthenia  gravis, Lambert-Eaton syndrome, or ALS.  It is also possible that as with any injection, there may be an allergic reaction or no effect from the medication. Reduced effectiveness after repeated injections is sometimes seen and rarely infection at the injection site may occur. All care will be taken to prevent these side effects. If therapy is given over a long time, atrophy and wasting in the muscle injected may occur. Occasionally the patient's become refractory to treatment because they develop antibodies to the toxin. In this event, therapy needs to be modified.  I have read the above information and consent to the administration of botulism toxin.    BOTOX PROCEDURE NOTE FOR MIGRAINE HEADACHE  Contraindications and precautions discussed with patient(above). Aseptic procedure was observed and patient tolerated procedure. Procedure performed by Frann Rider, NP-BC.   The condition has existed for more than 6 months, and pt does not have a diagnosis of ALS, Myasthenia Gravis or Lambert-Eaton Syndrome.  Risks and benefits of injections discussed and pt agrees to proceed with the procedure.  Written consent obtained  These injections are medically necessary. Pt  receives good benefits from these injections. These injections do not cause sedations or hallucinations which the oral therapies may cause.   Description of procedure:  The patient was placed in a sitting position. The standard protocol was used for Botox as follows, with 5 units of Botox injected at each site:  -Procerus muscle, midline injection  -Corrugator muscle, bilateral injection  -Frontalis muscle, bilateral injection, with 2 sites each side, medial injection was performed in the upper one third of the frontalis muscle, in the  region vertical from the medial inferior edge of the superior orbital rim. The lateral injection was again in the upper one third of the forehead vertically above the lateral limbus of the cornea, 1.5  cm lateral to the medial injection site.  -Temporalis muscle injection, 4 sites, bilaterally. The first injection was 3 cm above the tragus of the ear, second injection site was 1.5 cm to 3 cm up from the first injection site in line with the tragus of the ear. The third injection site was 1.5-3 cm forward between the first 2 injection sites. The fourth injection site was 1.5 cm posterior to the second injection site. 5th site laterally in the temporalis  muscleat the level of the outer canthus.  -Occipitalis muscle injection, 3 sites, bilaterally. The first injection was done one half way between the occipital protuberance and the tip of the mastoid process behind the ear. The second injection site was done lateral and superior to the first, 1 fingerbreadth from the first injection. The third injection site was 1 fingerbreadth superiorly and medially from the first injection site.  -Cervical paraspinal muscle injection, 2 sites, bilaterally. The first injection site was 1 cm from the midline of the cervical spine, 3 cm inferior to the lower border of the occipital protuberance. The second injection site was 1.5 cm superiorly and laterally to the first injection site.  -Trapezius muscle injection was performed at 3 sites, bilaterally. The first injection site was in the upper trapezius muscle halfway between the inflection point of the neck, and the acromion. The second injection site was one half way between the acromion and the first injection site. The third injection was done between the first injection site and the inflection point of the neck.    A total of 200 units of Botox was prepared, 155 units of Botox was injected as documented above, any Botox not injected was wasted. The patient tolerated the procedure well, there were no complications of the above procedure.    Frann Rider, AGNP-BC  [facility] [address] [place], [place] [address]  Phone  [phone] Fax [phone] Note: This document was prepared with digital dictation and possible smart phrase technology. Any transcriptional errors that result from this process are unintentional.

## 2022-01-14 ENCOUNTER — Other Ambulatory Visit (HOSPITAL_COMMUNITY): Payer: Self-pay

## 2022-01-14 ENCOUNTER — Other Ambulatory Visit: Payer: Self-pay

## 2022-01-14 ENCOUNTER — Encounter: Payer: Self-pay | Admitting: Adult Health

## 2022-01-14 ENCOUNTER — Ambulatory Visit (INDEPENDENT_AMBULATORY_CARE_PROVIDER_SITE_OTHER): Payer: PPO | Admitting: Adult Health

## 2022-01-14 DIAGNOSIS — G43709 Chronic migraine without aura, not intractable, without status migrainosus: Secondary | ICD-10-CM | POA: Diagnosis not present

## 2022-01-14 DIAGNOSIS — M542 Cervicalgia: Secondary | ICD-10-CM

## 2022-01-14 MED ORDER — ONABOTULINUMTOXINA 200 UNITS IJ SOLR
155.0000 [IU] | Freq: Once | INTRAMUSCULAR | Status: AC
  Administered 2022-01-14: 155 [IU] via INTRAMUSCULAR

## 2022-01-14 NOTE — Progress Notes (Signed)
Botox- 200 units x 1 vial Lot: [id number] Expiration: 05.2026 NDC: 0023-3921-02  Bacteriostatic 0.9% Sodium Chloride- 72m total Lot: [id number]: 09.01.2024 NDC: [address]-37 Dx: g43.709 S/P     Made pt aware that she needed to call the pharmacy and pay copay to get original botox sent so she doesn't get charged for b/b vial. Pt verbalized understanding.

## 2022-01-16 ENCOUNTER — Other Ambulatory Visit (HOSPITAL_COMMUNITY): Payer: Self-pay

## 2022-01-21 ENCOUNTER — Other Ambulatory Visit (HOSPITAL_COMMUNITY): Payer: Self-pay

## 2022-01-22 ENCOUNTER — Other Ambulatory Visit (HOSPITAL_COMMUNITY): Payer: Self-pay

## 2022-01-23 ENCOUNTER — Other Ambulatory Visit (HOSPITAL_COMMUNITY): Payer: Self-pay

## 2022-01-31 ENCOUNTER — Other Ambulatory Visit (HOSPITAL_COMMUNITY): Payer: Self-pay

## 2022-04-08 ENCOUNTER — Ambulatory Visit: Payer: PPO | Admitting: Adult Health

## 2022-04-08 ENCOUNTER — Encounter: Payer: Self-pay | Admitting: Neurology

## 2022-04-08 ENCOUNTER — Telehealth: Payer: Self-pay | Admitting: Adult Health

## 2022-04-08 MED ORDER — NURTEC 75 MG PO TBDP: ORAL_TABLET | ORAL | 3 refills | Status: DC

## 2022-04-08 NOTE — Addendum Note (Signed)
Addended by: Gildardo Griffes on: 04/08/2022 03:14 PM   Modules accepted: Orders

## 2022-04-08 NOTE — Telephone Encounter (Signed)
LVM and sent mychart msg informing pt of r/s needed for today's appt- NP out.

## 2022-04-14 ENCOUNTER — Telehealth: Payer: Self-pay | Admitting: Adult Health

## 2022-04-14 ENCOUNTER — Other Ambulatory Visit (HOSPITAL_COMMUNITY): Payer: Self-pay

## 2022-04-14 NOTE — Telephone Encounter (Signed)
Pt needs to contact [facility] to schedule Botox shipment.

## 2022-04-15 ENCOUNTER — Other Ambulatory Visit (HOSPITAL_COMMUNITY): Payer: Self-pay

## 2022-04-15 ENCOUNTER — Other Ambulatory Visit: Payer: Self-pay

## 2022-04-15 NOTE — Progress Notes (Unsigned)
Update 04/15/2022 JM: Patient is being seen for repeat Botox injection, prior injection 01/14/2022.    Reports currently experiencing 8-12 migraine days per month. Stopped Emgality since prior visit, denies any worsening of migraines. Does have some cervicalgia, referral placed to PT for dry needling as this could be contributing to migraine headaches.  Tolerated procedure well today.  Will follow-up in 3 months for repeat injection.    Consent Form Botulism Toxin Injection For Chronic Migraine    Reviewed orally with patient, additionally signature is on file:  Botulism toxin has been approved by the Federal drug administration for treatment of chronic migraine. Botulism toxin does not cure chronic migraine and it may not be effective in some patients.  The administration of botulism toxin is accomplished by injecting a small amount of toxin into the muscles of the neck and head. Dosage must be titrated for each individual. Any benefits resulting from botulism toxin tend to wear off after 3 months with a repeat injection required if benefit is to be maintained. Injections are usually done every 3-4 months with maximum effect peak achieved by about 2 or 3 weeks. Botulism toxin is expensive and you should be sure of what costs you will incur resulting from the injection.  The side effects of botulism toxin use for chronic migraine may include:   -Transient, and usually mild, facial weakness with facial injections  -Transient, and usually mild, head or neck weakness with head/neck injections  -Reduction or loss of forehead facial animation due to forehead muscle weakness  -Eyelid drooping  -Dry eye  -Pain at the site of injection or bruising at the site of injection  -Double vision  -Potential unknown long term risks   Contraindications: You should not have Botox if you are pregnant, nursing, allergic to albumin, have an infection, skin condition, or muscle weakness at the site of the  injection, or have myasthenia gravis, Lambert-Eaton syndrome, or ALS.  It is also possible that as with any injection, there may be an allergic reaction or no effect from the medication. Reduced effectiveness after repeated injections is sometimes seen and rarely infection at the injection site may occur. All care will be taken to prevent these side effects. If therapy is given over a long time, atrophy and wasting in the muscle injected may occur. Occasionally the patient's become refractory to treatment because they develop antibodies to the toxin. In this event, therapy needs to be modified.  I have read the above information and consent to the administration of botulism toxin.    BOTOX PROCEDURE NOTE FOR MIGRAINE HEADACHE  Contraindications and precautions discussed with patient(above). Aseptic procedure was observed and patient tolerated procedure. Procedure performed by Frann Rider, NP-BC.   The condition has existed for more than 6 months, and pt does not have a diagnosis of ALS, Myasthenia Gravis or Lambert-Eaton Syndrome.  Risks and benefits of injections discussed and pt agrees to proceed with the procedure.  Written consent obtained  These injections are medically necessary. Pt  receives good benefits from these injections. These injections do not cause sedations or hallucinations which the oral therapies may cause.   Description of procedure:  The patient was placed in a sitting position. The standard protocol was used for Botox as follows, with 5 units of Botox injected at each site:  -Procerus muscle, midline injection  -Corrugator muscle, bilateral injection  -Frontalis muscle, bilateral injection, with 2 sites each side, medial injection was performed in the upper one third of  the frontalis muscle, in the region vertical from the medial inferior edge of the superior orbital rim. The lateral injection was again in the upper one third of the forehead vertically above the  lateral limbus of the cornea, 1.5 cm lateral to the medial injection site.  -Temporalis muscle injection, 4 sites, bilaterally. The first injection was 3 cm above the tragus of the ear, second injection site was 1.5 cm to 3 cm up from the first injection site in line with the tragus of the ear. The third injection site was 1.5-3 cm forward between the first 2 injection sites. The fourth injection site was 1.5 cm posterior to the second injection site. 5th site laterally in the temporalis  muscleat the level of the outer canthus.  -Occipitalis muscle injection, 3 sites, bilaterally. The first injection was done one half way between the occipital protuberance and the tip of the mastoid process behind the ear. The second injection site was done lateral and superior to the first, 1 fingerbreadth from the first injection. The third injection site was 1 fingerbreadth superiorly and medially from the first injection site.  -Cervical paraspinal muscle injection, 2 sites, bilaterally. The first injection site was 1 cm from the midline of the cervical spine, 3 cm inferior to the lower border of the occipital protuberance. The second injection site was 1.5 cm superiorly and laterally to the first injection site.  -Trapezius muscle injection was performed at 3 sites, bilaterally. The first injection site was in the upper trapezius muscle halfway between the inflection point of the neck, and the acromion. The second injection site was one half way between the acromion and the first injection site. The third injection was done between the first injection site and the inflection point of the neck.    A total of 200 units of Botox was prepared, 155 units of Botox was injected as documented above, any Botox not injected was wasted. The patient tolerated the procedure well, there were no complications of the above procedure.    Frann Rider, AGNP-BC  [facility] [address] [place], [place] [address]  Phone [phone] Fax [phone] Note: This document was prepared with digital dictation and possible smart phrase technology. Any transcriptional errors that result from this process are unintentional.

## 2022-04-16 ENCOUNTER — Ambulatory Visit: Payer: PPO | Admitting: Adult Health

## 2022-04-16 ENCOUNTER — Encounter: Payer: Self-pay | Admitting: Adult Health

## 2022-04-16 VITALS — BP 96/64 | HR 66

## 2022-04-16 DIAGNOSIS — G43709 Chronic migraine without aura, not intractable, without status migrainosus: Secondary | ICD-10-CM

## 2022-04-16 MED ORDER — ONABOTULINUMTOXINA 200 UNITS IJ SOLR
155.0000 [IU] | Freq: Once | INTRAMUSCULAR | Status: AC
  Administered 2022-04-16: 155 [IU] via INTRAMUSCULAR

## 2022-04-16 NOTE — Progress Notes (Signed)
Botox- 200 units x 1 vial [id number] Expiration: 06/2024 NDC: [id number]   Bacteriostatic 0.9% Sodium Chloride- 48mL total [id number] Expiration: 09/2022 NDC: [id number]   Dx: [id number] SP

## 2022-04-17 ENCOUNTER — Other Ambulatory Visit (HOSPITAL_COMMUNITY): Payer: Self-pay

## 2022-04-17 ENCOUNTER — Other Ambulatory Visit: Payer: Self-pay

## 2022-05-13 ENCOUNTER — Telehealth: Payer: Self-pay | Admitting: Neurology

## 2022-05-13 NOTE — Telephone Encounter (Signed)
Pt stated she needs a PA on Rimegepant Sulfate (NURTEC) 75 MG TBDP.

## 2022-05-15 ENCOUNTER — Telehealth: Payer: Self-pay

## 2022-05-15 ENCOUNTER — Other Ambulatory Visit (HOSPITAL_COMMUNITY): Payer: Self-pay

## 2022-05-15 NOTE — Telephone Encounter (Signed)
Tried to submit a PA on CMM-Is not submitting-will follow up and try to resubmit tomorrow.   KEY: [id number]

## 2022-05-15 NOTE — Telephone Encounter (Signed)
PA request submitted. New Encounter created for follow up. For additional info see Prior Auth telephone encounter from 05/13/2022.   PA started.

## 2022-06-13 ENCOUNTER — Telehealth: Payer: Self-pay

## 2022-06-13 ENCOUNTER — Other Ambulatory Visit (HOSPITAL_COMMUNITY): Payer: Self-pay

## 2022-06-13 NOTE — Telephone Encounter (Signed)
Patient Advocate Encounter   Received notification from [employer] that prior authorization is required for Nurtec 75MG  dispersible tablets   Submitted: 06-13-2022 Key [id number]  Status is pending

## 2022-06-15 ENCOUNTER — Other Ambulatory Visit (HOSPITAL_COMMUNITY): Payer: Self-pay

## 2022-06-15 NOTE — Telephone Encounter (Signed)
Pharmacy Patient Advocate Encounter  Prior Authorization for Nurtec 75MG  dispersible tablets has been approved by [employer] (ins).    PA # PA Case ID #: [id number] Effective dates: 06/13/2022 through 09/13/2022

## 2022-06-29 ENCOUNTER — Encounter: Payer: Self-pay | Admitting: Neurology

## 2022-06-30 MED ORDER — NURTEC 75 MG PO TBDP: ORAL_TABLET | ORAL | 3 refills | Status: DC

## 2022-07-03 ENCOUNTER — Other Ambulatory Visit (HOSPITAL_COMMUNITY): Payer: Self-pay

## 2022-07-07 ENCOUNTER — Other Ambulatory Visit (HOSPITAL_COMMUNITY): Payer: Self-pay

## 2022-07-15 ENCOUNTER — Ambulatory Visit: Payer: PPO | Admitting: Adult Health

## 2022-07-17 ENCOUNTER — Ambulatory Visit: Payer: PPO | Admitting: Adult Health

## 2022-07-17 DIAGNOSIS — G43709 Chronic migraine without aura, not intractable, without status migrainosus: Secondary | ICD-10-CM

## 2022-07-17 MED ORDER — ONABOTULINUMTOXINA 200 UNITS IJ SOLR
155.0000 [IU] | Freq: Once | INTRAMUSCULAR | Status: AC
Start: 2022-07-17 — End: 2022-07-17
  Administered 2022-07-17: 155 [IU] via INTRAMUSCULAR

## 2022-07-17 NOTE — Progress Notes (Addendum)
07/17/22: feels that botox is starting to work. She has 1-2 good months and then the headaches start to come back. Reports that nurtec works well to decreased severity.  Advised in the future may could try Qulipta   Update 04/15/2022 JM: Patient is being seen for repeat Botox injection, prior injection 01/14/2022.  Initial injection took a couple weeks to take effect and noticed improvement for about 3 weeks but then started to have increase in migraine headaches although severely improved and not associated with diplopia. She is unable to say exactly how many migraines over the past months she has had.  Currently waiting for Nurtec to be approved by insurance, continued use of Ubrelvy (although noted side effects) and frequent use of Tylenol. Will provide Nurtec samples while waiting for insurance approval. Advised to limit use of Tylenol as this could be contributing to rebound headache. Will follow up in 3 months for repeat injection    BOTOX PROCEDURE NOTE FOR MIGRAINE HEADACHE    Contraindications and precautions discussed with patient(above). Aseptic procedure was observed and patient tolerated procedure. Procedure performed by Butch Penny, NP  The condition has existed for more than 6 months, and pt does not have a diagnosis of ALS, Myasthenia Gravis or Lambert-Eaton Syndrome.  Risks and benefits of injections discussed and pt agrees to proceed with the procedure.  Written consent obtained  These injections are medically necessary.These injections do not cause sedations or hallucinations which the oral therapies may cause.  Indication/Diagnosis: chronic migraine BOTOX(J0585) injection was performed according to protocol by [employer]. 200 units of BOTOX was dissolved into 4 cc NS.   NDC: [address]-24  Type of toxin: Botox  Botox- 200 units x 1 vial Lot:  [id number] Expiration: 08/2024 NDC: 5366-4403-47   Bacteriostatic 0.9% Sodium Chloride- 4mL  Lot: [id number] Expiration:  04/21/2023 NDC: 3875-6433-29     Description of procedure:  The patient was placed in a sitting position. The standard protocol was used for Botox as follows, with 5 units of Botox injected at each site:   -Procerus muscle, midline injection  -Corrugator muscle, bilateral injection  -Frontalis muscle, bilateral injection, with 2 sites each side, medial injection was performed in the upper one third of the frontalis muscle, in the region vertical from the medial inferior edge of the superior orbital rim. The lateral injection was again in the upper one third of the forehead vertically above the lateral limbus of the cornea, 1.5 cm lateral to the medial injection site.  -Temporalis muscle injection, 4 sites, bilaterally. The first injection was 3 cm above the tragus of the ear, second injection site was 1.5 cm to 3 cm up from the first injection site in line with the tragus of the ear. The third injection site was 1.5-3 cm forward between the first 2 injection sites. The fourth injection site was 1.5 cm posterior to the second injection site.  -Occipitalis muscle injection, 3 sites, bilaterally. The first injection was done one half way between the occipital protuberance and the tip of the mastoid process behind the ear. The second injection site was done lateral and superior to the first, 1 fingerbreadth from the first injection. The third injection site was 1 fingerbreadth superiorly and medially from the first injection site.  -Cervical paraspinal muscle injection, 2 sites, bilateral knee first injection site was 1 cm from the midline of the cervical spine, 3 cm inferior to the lower border of the occipital protuberance. The second injection site was 1.5 cm superiorly and laterally  to the first injection site.  -Trapezius muscle injection was performed at 3 sites, bilaterally. The first injection site was in the upper trapezius muscle halfway between the inflection point of the neck, and the  acromion. The second injection site was one half way between the acromion and the first injection site. The third injection was done between the first injection site and the inflection point of the neck.   Will return for repeat injection in 3 months.   A 200 unit sof Botox was used, 155 units were injected, the rest of the Botox was wasted. The patient tolerated the procedure well, there were no complications of the above procedure.  Butch Penny, MSN, NP-C 07/17/2022, 8:57 AM [facility] [address], [address] [place], [place] [address] [phone]

## 2022-07-17 NOTE — Progress Notes (Signed)
Botox- 200 units x 1 vial Lot:  [id number] Expiration: 08/2024 NDC: 4540-9811-91  Bacteriostatic 0.9% Sodium Chloride- 4mL  Lot: [id number] Expiration: 04/21/2023 NDC: 6213-0865-78  Dx: I69.629 S/P  Witnessed by Delmer Islam

## 2022-09-29 ENCOUNTER — Other Ambulatory Visit: Payer: Self-pay | Admitting: Neurology

## 2022-09-29 ENCOUNTER — Other Ambulatory Visit (HOSPITAL_COMMUNITY): Payer: Self-pay

## 2022-09-29 DIAGNOSIS — G43709 Chronic migraine without aura, not intractable, without status migrainosus: Secondary | ICD-10-CM

## 2022-10-01 ENCOUNTER — Other Ambulatory Visit: Payer: Self-pay | Admitting: Neurology

## 2022-10-01 ENCOUNTER — Other Ambulatory Visit (HOSPITAL_COMMUNITY): Payer: Self-pay

## 2022-10-01 DIAGNOSIS — G43709 Chronic migraine without aura, not intractable, without status migrainosus: Secondary | ICD-10-CM

## 2022-10-02 ENCOUNTER — Other Ambulatory Visit (HOSPITAL_COMMUNITY): Payer: Self-pay

## 2022-10-02 MED ORDER — BOTOX 200 UNITS IJ SOLR
INTRAMUSCULAR | 1 refills | Status: DC
Start: 2022-10-02 — End: 2023-04-24
  Filled 2022-10-02: qty 1, 84d supply, fill #0
  Filled 2022-12-30: qty 1, 84d supply, fill #1

## 2022-10-02 NOTE — Telephone Encounter (Signed)
[facility] is needing a new rx to fill, please send it over. Thank you!

## 2022-10-03 ENCOUNTER — Other Ambulatory Visit: Payer: Self-pay

## 2022-10-03 ENCOUNTER — Other Ambulatory Visit (HOSPITAL_COMMUNITY): Payer: Self-pay

## 2022-10-08 ENCOUNTER — Other Ambulatory Visit (HOSPITAL_COMMUNITY): Payer: Self-pay

## 2022-10-09 ENCOUNTER — Encounter: Payer: Self-pay | Admitting: Adult Health

## 2022-10-09 ENCOUNTER — Ambulatory Visit: Payer: PPO | Admitting: Adult Health

## 2022-10-09 DIAGNOSIS — M542 Cervicalgia: Secondary | ICD-10-CM

## 2022-10-09 DIAGNOSIS — G43709 Chronic migraine without aura, not intractable, without status migrainosus: Secondary | ICD-10-CM

## 2022-10-09 MED ORDER — NURTEC 75 MG PO TBDP
ORAL_TABLET | ORAL | 11 refills | Status: DC
Start: 2022-10-09 — End: 2023-04-27

## 2022-10-09 MED ORDER — ONABOTULINUMTOXINA 200 UNITS IJ SOLR
155.0000 [IU] | Freq: Once | INTRAMUSCULAR | Status: AC
Start: 2022-10-09 — End: 2022-10-09
  Administered 2022-10-09: 155 [IU] via INTRAMUSCULAR

## 2022-10-09 MED ORDER — QULIPTA 60 MG PO TABS
60.0000 mg | ORAL_TABLET | Freq: Every day | ORAL | 11 refills | Status: DC
Start: 2022-10-09 — End: 2023-04-27

## 2022-10-09 NOTE — Progress Notes (Signed)
Botox- 200 units x 1 vial Lot: [id number] Expiration: 12/2024 NDC: 6644-0347-42  Bacteriostatic 0.9% Sodium Chloride- * mL  Lot: [id number] Expiration: 04/21/23 NDC: 7564-3329-51  Dx: 43.709 S/P Witnessed by Marlana Latus, RN

## 2022-10-09 NOTE — Progress Notes (Signed)
Update 10/09/2022 JM: Returns for repeat Botox. Prior injection 07/17/2022 by Aundra Millet, NP. Currently having about 4-5 migraines per month, was having 15 migraine days per month prior to botox. Will use Nurtec which will take the edge off, migraines usually last up to 4 hours. She wishes to continue with Nurtec. Recommend starting Qulipta in addition to Botox for further migraine management. Does have some increased neck pain. Referral will be placed to PT requesting evaluation for dry needling.  Tolerated procedure well today. Will return in 3 months for repeat injection.       Consent Form Botulism Toxin Injection For Chronic Migraine    Reviewed orally with patient, additionally signature is on file:  Botulism toxin has been approved by the Federal drug administration for treatment of chronic migraine. Botulism toxin does not cure chronic migraine and it may not be effective in some patients.  The administration of botulism toxin is accomplished by injecting a small amount of toxin into the muscles of the neck and head. Dosage must be titrated for each individual. Any benefits resulting from botulism toxin tend to wear off after 3 months with a repeat injection required if benefit is to be maintained. Injections are usually done every 3-4 months with maximum effect peak achieved by about 2 or 3 weeks. Botulism toxin is expensive and you should be sure of what costs you will incur resulting from the injection.  The side effects of botulism toxin use for chronic migraine may include:   -Transient, and usually mild, facial weakness with facial injections  -Transient, and usually mild, head or neck weakness with head/neck injections  -Reduction or loss of forehead facial animation due to forehead muscle weakness  -Eyelid drooping  -Dry eye  -Pain at the site of injection or bruising at the site of injection  -Double vision  -Potential unknown long term risks   Contraindications: You  should not have Botox if you are pregnant, nursing, allergic to albumin, have an infection, skin condition, or muscle weakness at the site of the injection, or have myasthenia gravis, Lambert-Eaton syndrome, or ALS.  It is also possible that as with any injection, there may be an allergic reaction or no effect from the medication. Reduced effectiveness after repeated injections is sometimes seen and rarely infection at the injection site may occur. All care will be taken to prevent these side effects. If therapy is given over a long time, atrophy and wasting in the muscle injected may occur. Occasionally the patient's become refractory to treatment because they develop antibodies to the toxin. In this event, therapy needs to be modified.  I have read the above information and consent to the administration of botulism toxin.    BOTOX PROCEDURE NOTE FOR MIGRAINE HEADACHE  Contraindications and precautions discussed with patient(above). Aseptic procedure was observed and patient tolerated procedure. Procedure performed by Ihor Austin, AGNP-BC.   The condition has existed for more than 6 months, and pt does not have a diagnosis of ALS, Myasthenia Gravis or Lambert-Eaton Syndrome.  Risks and benefits of injections discussed and pt agrees to proceed with the procedure.  Written consent obtained  These injections are medically necessary. Pt  receives good benefits from these injections. These injections do not cause sedations or hallucinations which the oral therapies may cause.   Description of procedure:  The patient was placed in a sitting position. The standard protocol was used for Botox as follows, with 5 units of Botox injected at each site:  -  Procerus muscle, midline injection  -Corrugator muscle, bilateral injection  -Frontalis muscle, bilateral injection, with 2 sites each side, medial injection was performed in the upper one third of the frontalis muscle, in the region vertical from  the medial inferior edge of the superior orbital rim. The lateral injection was again in the upper one third of the forehead vertically above the lateral limbus of the cornea, 1.5 cm lateral to the medial injection site.  -Temporalis muscle injection, 4 sites, bilaterally. The first injection was 3 cm above the tragus of the ear, second injection site was 1.5 cm to 3 cm up from the first injection site in line with the tragus of the ear. The third injection site was 1.5-3 cm forward between the first 2 injection sites. The fourth injection site was 1.5 cm posterior to the second injection site. 5th site laterally in the temporalis  muscleat the level of the outer canthus.  -Occipitalis muscle injection, 3 sites, bilaterally. The first injection was done one half way between the occipital protuberance and the tip of the mastoid process behind the ear. The second injection site was done lateral and superior to the first, 1 fingerbreadth from the first injection. The third injection site was 1 fingerbreadth superiorly and medially from the first injection site.  -Cervical paraspinal muscle injection, 2 sites, bilaterally. The first injection site was 1 cm from the midline of the cervical spine, 3 cm inferior to the lower border of the occipital protuberance. The second injection site was 1.5 cm superiorly and laterally to the first injection site.  -Trapezius muscle injection was performed at 3 sites, bilaterally. The first injection site was in the upper trapezius muscle halfway between the inflection point of the neck, and the acromion. The second injection site was one half way between the acromion and the first injection site. The third injection was done between the first injection site and the inflection point of the neck.    A total of 200 units of Botox was prepared, 155 units of Botox was injected as documented above, any Botox not injected was wasted. The patient tolerated the procedure well,  there were no complications of the above procedure.   Ihor Austin, AGNP-BC  [facility] [address] [place], [place] [address]  Phone [phone] Fax [phone] Note: This document was prepared with digital dictation and possible smart phrase technology. Any transcriptional errors that result from this process are unintentional.

## 2022-10-16 ENCOUNTER — Other Ambulatory Visit (HOSPITAL_COMMUNITY): Payer: Self-pay

## 2022-10-16 ENCOUNTER — Telehealth: Payer: Self-pay

## 2022-10-16 NOTE — Telephone Encounter (Signed)
Pharmacy Patient Advocate Encounter   Received notification from CoverMyMeds that prior authorization for Nurtec 75MG  dispersible tablets is required/requested.   Insurance verification completed.   The patient is insured through [facility] ADVANTAGE/RX ADVANCE .   Per test claim: PA required; PA submitted to [facility] ADVANTAGE/RX ADVANCE via CoverMyMeds Key/confirmation #/EOC [id number] Status is pending

## 2022-10-16 NOTE — Telephone Encounter (Signed)
Pharmacy Patient Advocate Encounter   Received notification from CoverMyMeds that prior authorization for Qulipta 60MG  tablets is required/requested.   Insurance verification completed.   The patient is insured through [employer] .   Per test claim: PA required; PA submitted to [facility] ADVANTAGE/RX ADVANCE via CoverMyMeds Key/confirmation #/EOC [id number] Status is pending

## 2022-10-17 ENCOUNTER — Telehealth: Payer: Self-pay | Admitting: Neurology

## 2022-10-17 NOTE — Telephone Encounter (Signed)
Rishibh from [facility] Advantage is asking for a call for questions to be answered re: PA for pt's [place]

## 2022-10-20 NOTE — Telephone Encounter (Signed)
Received a faxed request for add info-faxed completed form along with clinical to [phone].

## 2022-10-22 ENCOUNTER — Other Ambulatory Visit (HOSPITAL_COMMUNITY): Payer: Self-pay

## 2022-10-22 NOTE — Telephone Encounter (Signed)
Pharmacy Patient Advocate Encounter  Received notification from [facility] ADVANTAGE/RX ADVANCE that Prior Authorization for Nurtec 75MG  dispersible tablets has been APPROVED from 10/17/2022 to 10/16/2023. Ran test claim, Copay is $11.20. This test claim was processed through [facility]- copay amounts may vary at other pharmacies due to pharmacy/plan contracts, or as the patient moves through the different stages of their insurance plan.   PA #/Case ID/Reference #: PA Case ID #: [id number]

## 2022-10-23 ENCOUNTER — Ambulatory Visit: Payer: PPO | Attending: Adult Health | Admitting: Physical Therapy

## 2022-10-23 ENCOUNTER — Encounter: Payer: Self-pay | Admitting: Physical Therapy

## 2022-10-23 ENCOUNTER — Other Ambulatory Visit: Payer: Self-pay

## 2022-10-23 DIAGNOSIS — G43709 Chronic migraine without aura, not intractable, without status migrainosus: Secondary | ICD-10-CM | POA: Insufficient documentation

## 2022-10-23 DIAGNOSIS — M542 Cervicalgia: Secondary | ICD-10-CM | POA: Diagnosis not present

## 2022-10-23 DIAGNOSIS — M546 Pain in thoracic spine: Secondary | ICD-10-CM

## 2022-10-23 DIAGNOSIS — M5459 Other low back pain: Secondary | ICD-10-CM

## 2022-10-23 DIAGNOSIS — G8929 Other chronic pain: Secondary | ICD-10-CM

## 2022-10-23 DIAGNOSIS — M25511 Pain in right shoulder: Secondary | ICD-10-CM | POA: Insufficient documentation

## 2022-10-23 DIAGNOSIS — M25512 Pain in left shoulder: Secondary | ICD-10-CM | POA: Insufficient documentation

## 2022-10-23 DIAGNOSIS — R252 Cramp and spasm: Secondary | ICD-10-CM | POA: Diagnosis not present

## 2022-10-23 NOTE — Therapy (Signed)
OUTPATIENT PHYSICAL THERAPY CERVICAL EVALUATION   Patient Name: Mary [name] MRN: [id number] DOB:1970-10-05, 52 y.o., female Today's Date: 10/23/2022  END OF SESSION:  PT End of Session - 10/23/22 1110     Visit Number 1    Date for PT Re-Evaluation 12/18/22    Authorization Type [employer]    PT Start Time 1017    PT Stop Time 1102    PT Time Calculation (min) 45 min    Activity Tolerance Patient limited by pain;Other (comment)   fear of movement   Behavior During Therapy Anxious             Past Medical History:  Diagnosis Date   Asthma    Bicornuate uterus    Depression    Narcolepsy without cataplexy(347.00)    sleep studies 1999: AHI 4/hr, MSLT 4/2. 08/16/09 AHI 5.7/RDI 12.6   Rhinitis    Past Surgical History:  Procedure Laterality Date   CESAREAN SECTION     GASTRIC BYPASS     2018   MYRINGOTOMY  04/04/2011   TONSILLECTOMY     UPPP for OSA  01/20/1997   Patient Active Problem List   Diagnosis Date Noted   Chronic migraine without aura without status migrainosus, not intractable 12/15/2021   Subchorionic hemorrhage in first trimester 01/14/2013   Bicornuate uterus    Allergic reaction 06/27/2011   Family hx of colon cancer 04/16/2011   Bilateral otitis media with effusion 03/25/2011   Strep pharyngitis 03/25/2011   Asthma in adult, mild intermittent, uncomplicated 08/30/2009   ATTENTION DEFICIT DISORDER, ADULT 07/03/2009   UNSPECIFIED VITAMIN D DEFICIENCY 05/01/2009   FATIGUE 05/01/2009   MORBID OBESITY 11/21/2008   DEPRESSIVE DISORDER 11/21/2008   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/21/2008    PCP: Ralene Ok, PA-C  REFERRING PROVIDER: Ihor Austin, NP  REFERRING DIAG: G43.709 (ICD-10-CM) - Chronic migraine without aura without status migrainosus, not intractable M54.2 (ICD-10-CM) - Cervicalgia  THERAPY DIAG:  Cervicalgia  Chronic pain of both shoulders  Pain in thoracic spine  Other low back pain  Cramp and  spasm  Rationale for Evaluation and Treatment: Rehabilitation  ONSET DATE: 8 years ago, started with pregnancy  SUBJECTIVE:                                                                                                                                                                                                         SUBJECTIVE STATEMENT: I have had migraines and neck pain for 8 years after I got pregnant with my daughter.  I have had 4-5 rounds of  Botox to address my headaches - the last round was 2 weeks ago.  It helps me about 50% but only lasts about 3 weeks. I have migraines 1-2x/week lasting all day into the next day.  Starts in the eye on either Rt or Lt. Feels like someone is pulling my hair out.  I will have nausea and vomiting.   I always have neck pain whether I have a migraine or not.  I use a TENS machine but it doesn't work very well.   I will get tingling into my shoulders and arms, between my shoulder blades and into my Lt low back.  My fingers lock up when I use them and I have to pry them back open.   I am a single mom of an 8yo and 14yo and feel like my 8yo is missing out on life because of my pain and how it limits me.  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  He needs me to steady him with gait and this can sometimes make me worse.    Hand dominance: Right  PERTINENT HISTORY:  Getting Botox for migraines - neck, head bil  PAIN:  PAIN:  Are you having pain? Yes NPRS scale: 8/10 Pain location: bil upper traps and neck Pain orientation: Bilateral  PAIN TYPE: aching, dull, and tight Pain description: constant  Aggravating factors: sometimes when I turn my head, it will pop Relieving factors: uses TENS but not helpful, heat Warmie, ice, biofreeze, muscle relaxer, Botox   PRECAUTIONS: None  RED FLAGS: None     WEIGHT BEARING RESTRICTIONS: No  FALLS:  Has patient fallen in last 6  months? 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 He does need some physical support when walking which can exacerbate some neck pain  PLOF: Independent  PATIENT GOALS: relief of neck pain and headaches  NEXT MD VISIT: 3 weeks  OBJECTIVE:  Note: Objective measures were completed at Evaluation unless otherwise noted.  DIAGNOSTIC FINDINGS:    PATIENT SURVEYS:  NDI 38/50  COGNITION: Overall cognitive status: Within functional limits for tasks assessed  SENSATION: Neck tingling which will extend into Lt lower back  and shoulder blades and into arms, my fingers will lock up  POSTURE: rounded shoulders, forward head, and sway back, reduced cervical lordosis with visible lines of platysma and upper trap tone bil  PALPATION: Tender to light palpation bil upper traps, cervical paraspinals and multifidi, SO, scalenes, SCM, pectorals, temporalis, rhomboids, thoracic paraspinals, lumbar paraspinal on Lt   CERVICAL ROM:  *Pain in all directions, guarded and fearful of movement Active ROM A/PROM (deg) eval  Flexion 20*  Extension 20*  Right lateral flexion 12*  Left lateral flexion 12*  Right rotation 25*  Left rotation 30*   (Blank rows = not tested)  UPPER EXTREMITY ROM: Limited flexion bil to 125 secondary to pain, passive is full bil but pain Limited 50% shoulder ROM in all other planes secondary to neck pain and shoulder pain with movement - guarded and fearful   UPPER EXTREMITY MMT: 3+/5 throughout - Pt admits fear of giving effort with MMT    TODAY'S TREATMENT:  DATE:  10/23/22: Discussion about TENS  unit Curable app and Calm app Discussion about how fear of movement is likely feeding into pain and spasm Initiated gentle supine ROM for HEP Gave DN handout Trigger Point Dry-Needling  Treatment instructions: Expect mild to moderate muscle soreness. S/S of pneumothorax if dry needled over a lung field, and to seek immediate medical attention should they occur. Patient verbalized understanding of these instructions and education.  Patient Consent Given: Yes Education handout provided: Yes Muscles treated: bil upper traps Electrical stimulation performed: No Parameters: N/A Treatment response/outcome: Pt with full body bracing and reaction to DN.  No TP present on Lt upper trap, Rt upper trap with twitch but Pt did not tolerate needle being in for very long   PATIENT EDUCATION:  Education details: [email or web address] Person educated: Patient Education method: Explanation, Demonstration, and Handouts Education comprehension: verbalized understanding and returned demonstration  HOME EXERCISE PROGRAM: Access Code: [email or web address] URL: [email or web address] Date: 10/23/2022 Prepared by: Loistine Simas Xavier Fournier  Exercises - Supine Cervical Retraction with Towel  - 2 x daily - 7 x weekly - 1 sets - 10 reps - 5  hold - Supine Cervical Rotation AROM on Pillow  - 2 x daily - 7 x weekly - 1 sets - 10 reps - Seated Scapular Retraction  - 1 x daily - 7 x weekly - 2 sets - 10 reps  ASSESSMENT:  CLINICAL IMPRESSION: Patient is a 52 y.o. female who was seen today for physical therapy evaluation and treatment for chronic 8 year history of migraines and neck pain (started with pregnancy with her 8yo daughter.)  Pt is a single mom of her 8yo daughter and 14yo son.  She has had 4-5 rounds of Botox for migraines (last one was 2 weeks ago) which tend to reduce migraine intensity by 50% but only provides relief for 3 weeks.  With migraines there is a visual component and nausea/vomiting. Neck pain is constant and  rated 8/10.  Pain is bil throughout thoracic spine, scapulae, and bil upper traps to head.  She describes intermittent tingling that occurs into both arms to hands and into bil scapulae and down to lumbar spine. She presents with sway back, rounded shoulders and forward head.  There is reduced cervical lordosis and visible lines of platysma and upper traps.  Pt is extremely fearful of movement which limited assessment of cervical/UE ROM and MMT today.  NDI score is 38/50, demonstrating high level of disability due to pain.  Pt had full body guarding and flinching reaction to DN today to bil upper traps. PT provided info on TENS, Curable and Calm apps with pain neuroscience education, and supine gentle AROM to encourage movement.  We discussed fear of movement and how it is likely playing into guarding and spasm/pain.   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  He does need to hold onto Pt for support with gait which can sometimes make symptoms worse.   Pt will benefit from trial of DN, pain neuroscience education, restoration of postural alignment and postural strength to reduce pain and maximize function and QOL.    OBJECTIVE IMPAIRMENTS: decreased activity tolerance, decreased coordination, decreased knowledge of condition, decreased mobility, decreased ROM, decreased strength, hypomobility, increased muscle spasms, impaired flexibility, impaired sensation, impaired tone, impaired UE functional use, improper body mechanics, postural dysfunction, and pain.   ACTIVITY LIMITATIONS: carrying, lifting, bending, sitting, standing, sleeping, transfers, bed mobility, bathing, dressing, reach over head,  hygiene/grooming, locomotion level, and caring for others  PARTICIPATION LIMITATIONS: meal prep, cleaning, laundry, driving, shopping, community activity, occupation, and yard work  PERSONAL FACTORS: Time since onset of injury/illness/exacerbation are  also affecting patient's functional outcome.   REHAB POTENTIAL: Good  CLINICAL DECISION MAKING: Stable/uncomplicated  EVALUATION COMPLEXITY: Low   GOALS: Goals reviewed with patient? Yes  SHORT TERM GOALS: Target date: 11/21/22  Pt will be educated on pain neuroscience and explore tools for chronic pain management and anxiety related to pain via apps, TENS, modalities and HEP Baseline:  Goal status: INITIAL  2.  Pt will be able to restore proper postural alignment without pain Baseline: protective rounded shoulders, forward head Goal status: INITIAL  3.  Pt will report reduced neck pain by at least 20% Baseline:  Goal status: INITIAL  4.  Pt will report improved sleep by at least 20% Baseline:  Goal status: INITIAL  5.  Pt will be able to perform reaching tasks into overhead cabinet without exacerbation of pain Baseline:  Goal status: INITIAL    LONG TERM GOALS: Target date: 12/18/22  Pt will be ind with advanced HEP for ROM, stretching, postural strength and UE strength to maximize daily task performance. Baseline:  Goal status: INITIAL  2.  Pt will score </= 30/50 on NDI to demo reduced disability due to pain Baseline: 38/50 Goal status: INITIAL  3.  Pt will achieve at least 40 deg bil neck rotation to improve driving Baseline: 25 and 30 deg  Goal status: INITIAL  4.  Pt will report reduced pain with ADLs and light household task performance by at least 70% Baseline:  Goal status: INITIAL  5.  Pt will report improved sleep by at least 50% Baseline:  Goal status: INITIAL  6.  Pt will achieve functional strength of trunk and UE to enable lifting and carrying for grocery shopping, dishes and laundry. Baseline:  Goal status: INITIAL   PLAN:  PT FREQUENCY: 1-2x/week  PT DURATION: 8 weeks  PLANNED INTERVENTIONS: Therapeutic exercises, Therapeutic activity, Neuromuscular re-education, Patient/Family education, Self Care, Joint mobilization, Dry Needling,  Electrical stimulation, Spinal mobilization, Cryotherapy, Moist heat, Taping, Traction, and Manual therapy  PLAN FOR NEXT SESSION: f/u on TENS unit and apps (Curable and Calm), review HEP, encourage painfree ROM of neck/UE (try supine AA/ROM with dowel for UE, light tband), DN head/neck/thoracic (Pt very guarded with DN#1 at eval), heat, Pt sensitive to light   Navy Rothschild, PT 10/23/22 11:39 AM

## 2022-10-23 NOTE — Patient Instructions (Signed)

## 2022-10-24 NOTE — Telephone Encounter (Signed)
Pharmacy Patient Advocate Encounter  Received notification from [facility] ADVANTAGE/RX ADVANCE that Prior Authorization for Qulipta 60MG  tablets has been DENIED.  Full denial letter will be uploaded to the media tab. See denial reason below.      PA #/Case ID/Reference #: PA Case ID #: [id number]

## 2022-10-28 ENCOUNTER — Ambulatory Visit: Payer: PPO | Admitting: Rehabilitative and Restorative Service Providers"

## 2022-11-04 ENCOUNTER — Telehealth: Payer: Self-pay

## 2022-11-04 ENCOUNTER — Ambulatory Visit: Payer: PPO

## 2022-11-04 NOTE — Telephone Encounter (Signed)
Call made to patient to inform her that she missed her appointment today.  She states that she had called last week to say that she had Covid and that her doctor suggested she avoid going to appointments for 2 weeks and thought that we had taken the last appointment and today's appt out.  She was reminded of her next appt and she confirms that she will be here.

## 2022-11-12 ENCOUNTER — Ambulatory Visit: Payer: PPO

## 2022-11-12 DIAGNOSIS — G8929 Other chronic pain: Secondary | ICD-10-CM | POA: Diagnosis not present

## 2022-11-12 DIAGNOSIS — M546 Pain in thoracic spine: Secondary | ICD-10-CM

## 2022-11-12 DIAGNOSIS — R252 Cramp and spasm: Secondary | ICD-10-CM

## 2022-11-12 DIAGNOSIS — M5459 Other low back pain: Secondary | ICD-10-CM

## 2022-11-12 DIAGNOSIS — M542 Cervicalgia: Secondary | ICD-10-CM

## 2022-11-12 NOTE — Therapy (Addendum)
 OUTPATIENT PHYSICAL THERAPY CERVICAL TREATMENT PHYSICAL THERAPY DISCHARGE SUMMARY  Visits from Start of Care: 2  Current functional level related to goals / functional outcomes: See below   Remaining deficits: See below   Education / Equipment: See below   Patient agrees to discharge. Patient goals were partially met. Patient is being discharged due to not returning since the last visit.    Patient Name: Mary [name] MRN: [id number] DOB:1970/08/17, 52 y.o., female Today's Date: 11/12/2022  END OF SESSION:  PT End of Session - 11/12/22 1104     Visit Number 2    Date for PT Re-Evaluation 12/18/22    Authorization Type [employer]    PT Start Time 1104    PT Stop Time 1143    PT Time Calculation (min) 39 min    Activity Tolerance Patient limited by pain;Other (comment)    Behavior During Therapy Anxious             Past Medical History:  Diagnosis Date   Asthma    Bicornuate uterus    Depression    Narcolepsy without cataplexy(347.00)    sleep studies 1999: AHI 4/hr, MSLT 4/2. 08/16/09 AHI 5.7/RDI 12.6   Rhinitis    Past Surgical History:  Procedure Laterality Date   CESAREAN SECTION     GASTRIC BYPASS     2018   MYRINGOTOMY  04/04/2011   TONSILLECTOMY     UPPP for OSA  01/20/1997   Patient Active Problem List   Diagnosis Date Noted   Chronic migraine without aura without status migrainosus, not intractable 12/15/2021   Subchorionic hemorrhage in first trimester 01/14/2013   Bicornuate uterus    Allergic reaction 06/27/2011   Family hx of colon cancer 04/16/2011   Bilateral otitis media with effusion 03/25/2011   Strep pharyngitis 03/25/2011   Asthma in adult, mild intermittent, uncomplicated 08/30/2009   ATTENTION DEFICIT DISORDER, ADULT 07/03/2009   UNSPECIFIED VITAMIN D DEFICIENCY 05/01/2009   FATIGUE 05/01/2009   MORBID OBESITY 11/21/2008   DEPRESSIVE DISORDER 11/21/2008   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/21/2008    PCP: Lauraine Burkes, PA-C  REFERRING PROVIDER: Whitfield Raisin, NP  REFERRING DIAG: G43.709 (ICD-10-CM) - Chronic migraine without aura without status migrainosus, not intractable M54.2 (ICD-10-CM) - Cervicalgia  THERAPY DIAG:  Cervicalgia  Chronic pain of both shoulders  Pain in thoracic spine  Other low back pain  Cramp and spasm  Rationale for Evaluation and Treatment: Rehabilitation  ONSET DATE: 8 years ago, started with pregnancy  SUBJECTIVE:  SUBJECTIVE STATEMENT: Patient reports she had good response to her first DN.  She had symptoms return after several days but felt positive about the temporary relief and would like to try it again.    Hand dominance: Right  PERTINENT HISTORY:  Getting Botox  for migraines - neck, head bil  PAIN:  PAIN:  Are you having pain? Yes NPRS scale: 8/10 Pain location: bil upper traps and neck Pain orientation: Bilateral  PAIN TYPE: aching, dull, and tight Pain description: constant  Aggravating factors: sometimes when I turn my head, it will pop Relieving factors: uses TENS but not helpful, heat Warmie, ice, biofreeze, muscle relaxer, Botox    PRECAUTIONS: None  RED FLAGS: None     WEIGHT BEARING RESTRICTIONS: No  FALLS:  Has patient fallen in last 6 months? 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 He does need some physical support when walking which can exacerbate some neck pain  PLOF: Independent  PATIENT GOALS: relief of neck pain and headaches  NEXT MD VISIT: 3 weeks  OBJECTIVE:  Note:  Objective measures were completed at Evaluation unless otherwise noted.  DIAGNOSTIC FINDINGS:    PATIENT SURVEYS:  NDI 38/50  COGNITION: Overall cognitive status: Within functional limits for tasks assessed  SENSATION: Neck tingling which will extend into Lt lower back and shoulder blades and into arms, my fingers will lock up  POSTURE: rounded shoulders, forward head, and sway back, reduced cervical lordosis with visible lines of platysma and upper trap tone bil  PALPATION: Tender to light palpation bil upper traps, cervical paraspinals and multifidi, SO, scalenes, SCM, pectorals, temporalis, rhomboids, thoracic paraspinals, lumbar paraspinal on Lt   CERVICAL ROM:  *Pain in all directions, guarded and fearful of movement Active ROM A/PROM (deg) eval  Flexion 20*  Extension 20*  Right lateral flexion 12*  Left lateral flexion 12*  Right rotation 25*  Left rotation 30*   (Blank rows = not tested)  UPPER EXTREMITY ROM: Limited flexion bil to 125 secondary to pain, passive is full bil but pain Limited 50% shoulder ROM in all other planes secondary to neck pain and shoulder pain with movement - guarded and fearful   UPPER EXTREMITY MMT: 3+/5 throughout - Pt admits fear of giving effort with MMT    TODAY'S TREATMENT:                                                                                                                              DATE:  11/12/22: UBE x 3 min fwd only Reviewed HEP Added tband postural exercises including: standing shoulder ext, rows, bilateral shoulder ER and horizontal abduction 2 x 10 each.  Trigger Point Dry-Needling  Treatment instructions: Expect mild to moderate muscle soreness.  S/S of pneumothorax if dry needled over a lung field, and to seek immediate medical attention should they occur. Patient verbalized understanding of these instructions and education. Patient Consent Given: Yes Education handout provided: Yes Muscles treated: bil  upper traps, bilateral sub-occipital area, bilateral cervical multifidi Electrical stimulation performed: No Parameters: N/A Treatment response/outcome: Pt continues to have anxious reaction to DN but did seem to relax with repeated verbal cues.  She had good twitch response in bilateral upper traps and cervical multifidi.    10/23/22: Discussion about TENS unit Curable app and Calm app Discussion about how fear of movement is likely feeding into pain and spasm Initiated gentle supine ROM for HEP Gave DN handout Trigger Point Dry-Needling  Treatment instructions: Expect mild to moderate muscle soreness. S/S of pneumothorax if dry needled over a lung field, and to seek immediate medical attention should they occur. Patient verbalized understanding of these instructions and education.  Patient Consent Given: Yes Education handout provided: Yes Muscles treated: bil upper traps Electrical stimulation performed: No Parameters: N/A Treatment response/outcome: Pt with full body bracing and reaction to DN.  No TP present on Lt upper trap, Rt upper trap with twitch but Pt did not tolerate needle being in for very long   PATIENT EDUCATION:  Education details: [id number] Person educated: Patient Education method: Explanation, Demonstration, and Handouts Education comprehension: verbalized understanding and returned demonstration  HOME EXERCISE PROGRAM:   ASSESSMENT:  CLINICAL IMPRESSION: Mary [name] was able to tolerate addition of postural strengthening.  She admits she has been under a lot of stress as her Dad fell and suffered a spinal fracture.  She responded well to today's treatment and had good twitch responses during DN.  She would benefit from continued skilled PT to reduce frequency of migraines and improve quality of life.    OBJECTIVE IMPAIRMENTS: decreased activity tolerance, decreased coordination, decreased knowledge of condition, decreased mobility, decreased ROM, decreased strength,  hypomobility, increased muscle spasms, impaired flexibility, impaired sensation, impaired tone, impaired UE functional use, improper body mechanics, postural dysfunction, and pain.   ACTIVITY LIMITATIONS: carrying, lifting, bending, sitting, standing, sleeping, transfers, bed mobility, bathing, dressing, reach over head, hygiene/grooming, locomotion level, and caring for others  PARTICIPATION LIMITATIONS: meal prep, cleaning, laundry, driving, shopping, community activity, occupation, and yard work  PERSONAL FACTORS: Time since onset of injury/illness/exacerbation are also affecting patient's functional outcome.   REHAB POTENTIAL: Good  CLINICAL DECISION MAKING: Stable/uncomplicated  EVALUATION COMPLEXITY: Low   GOALS: Goals reviewed with patient? Yes  SHORT TERM GOALS: Target date: 11/21/22  Pt will be educated on pain neuroscience and explore tools for chronic pain management and anxiety related to pain via apps, TENS, modalities and HEP Baseline:  Goal status: INITIAL  2.  Pt will be able to restore proper postural alignment without pain Baseline: protective rounded shoulders, forward head Goal status: INITIAL  3.  Pt will report reduced neck pain by at least 20% Baseline:  Goal status: INITIAL  4.  Pt will report improved sleep by at least 20% Baseline:  Goal status: INITIAL  5.  Pt will be able to perform reaching tasks into overhead cabinet without exacerbation of pain Baseline:  Goal status: INITIAL    LONG TERM GOALS: Target date: 12/18/22  Pt will be ind with advanced HEP for ROM, stretching, postural strength and UE strength to maximize daily task performance. Baseline:  Goal status: INITIAL  2.  Pt will score </= 30/50 on NDI to demo reduced disability due to pain Baseline: 38/50  Goal status: INITIAL  3.  Pt will achieve at least 40 deg bil neck rotation to improve driving Baseline: 25 and 30 deg  Goal status: INITIAL  4.  Pt will report reduced pain  with ADLs and light household task performance by at least 70% Baseline:  Goal status: INITIAL  5.  Pt will report improved sleep by at least 50% Baseline:  Goal status: INITIAL  6.  Pt will achieve functional strength of trunk and UE to enable lifting and carrying for grocery shopping, dishes and laundry. Baseline:  Goal status: INITIAL   PLAN:  PT FREQUENCY: 1-2x/week  PT DURATION: 8 weeks  PLANNED INTERVENTIONS: Therapeutic exercises, Therapeutic activity, Neuromuscular re-education, Patient/Family education, Self Care, Joint mobilization, Dry Needling, Electrical stimulation, Spinal mobilization, Cryotherapy, Moist heat, Taping, Traction, and Manual therapy  PLAN FOR NEXT SESSION: UBE, postural strengthening, DN and other manual techniques.   Delon B. Mayce Noyes, PT 11/04/23 8:32 AM  Delon B. Einar Nolasco, PT 11/12/22 9:45 PM [facility] [address] [place], [place] [address] Phone # [phone] Fax [phone]

## 2022-11-19 ENCOUNTER — Ambulatory Visit: Payer: PPO

## 2022-11-26 ENCOUNTER — Ambulatory Visit: Payer: PPO

## 2022-12-22 ENCOUNTER — Other Ambulatory Visit (HOSPITAL_COMMUNITY): Payer: Self-pay

## 2022-12-26 ENCOUNTER — Other Ambulatory Visit (HOSPITAL_COMMUNITY): Payer: Self-pay

## 2022-12-26 ENCOUNTER — Encounter (HOSPITAL_COMMUNITY): Payer: Self-pay

## 2022-12-29 ENCOUNTER — Other Ambulatory Visit: Payer: Self-pay

## 2022-12-30 ENCOUNTER — Other Ambulatory Visit: Payer: Self-pay

## 2022-12-30 NOTE — Progress Notes (Signed)
Specialty Pharmacy Refill Coordination Note  Mary [name] is a 52 y.o. female contacted today regarding refills of specialty medication(s) Onabotulinumtoxina   Patient requested Courier to Provider Office   Delivery date: 12/31/22   Verified address: [facility] [address], [place] [address]   Medication will be filled on 12/30/22.

## 2023-01-01 ENCOUNTER — Ambulatory Visit: Payer: PPO | Admitting: Adult Health

## 2023-01-01 ENCOUNTER — Telehealth: Payer: Self-pay | Admitting: Adult Health

## 2023-01-01 NOTE — Telephone Encounter (Signed)
Pt cancelled appointment due to child has a fever. Transferred to Botox Coordinator to reschedule

## 2023-02-02 ENCOUNTER — Telehealth: Payer: Self-pay | Admitting: Adult Health

## 2023-02-02 NOTE — Telephone Encounter (Signed)
 Called [employer] to check the status of auth, rep states previous auth from last year has been extended.   PA# [id number] (12/17/21 - 01/20/24)

## 2023-02-09 ENCOUNTER — Ambulatory Visit: Payer: PPO | Admitting: Adult Health

## 2023-02-09 DIAGNOSIS — G43709 Chronic migraine without aura, not intractable, without status migrainosus: Secondary | ICD-10-CM | POA: Diagnosis not present

## 2023-02-09 MED ORDER — ONABOTULINUMTOXINA 200 UNITS IJ SOLR
155.0000 [IU] | Freq: Once | INTRAMUSCULAR | Status: AC
Start: 2023-02-09 — End: 2023-02-09
  Administered 2023-02-09: 155 [IU] via INTRAMUSCULAR

## 2023-02-09 NOTE — Progress Notes (Signed)
Botox- 200 units x 1 vial ZOX:[id number] Expiration:09/2024 NDC: 0023-3921-02  Bacteriostatic 0.9% Sodium Chloride- * mL  Lot: [id number] Expiration: 11/21/2023 NDC: 1478-2956-21  Dx: H08.657 S/P Witnessed by: Marlana Latus, RN

## 2023-02-09 NOTE — Progress Notes (Signed)
Update 02/09/2023 JM: Returns for repeat Botox. Prior injection 10/09/2022.  Reports great improvement of migraines with use of Botox and Qulipta, can experience 1 to 2/week, prior to Botox was experiencing up to 5/week. Use of Nurtec with benefit. She has had some worsening as she is 1 month overdue for botox (appt was cancelled as daughter was hospitalized with pancreatitis). Continues to have neck pain/stiffness, worked with PT but had difficulty tolerating dry needling, discussed importance of supportive pillows when sleeping and proper posture.  Tolerated procedure well today.  Will return in 3 months for repeat injection.      Consent Form Botulism Toxin Injection For Chronic Migraine    Reviewed orally with patient, additionally signature is on file:  Botulism toxin has been approved by the Federal drug administration for treatment of chronic migraine. Botulism toxin does not cure chronic migraine and it may not be effective in some patients.  The administration of botulism toxin is accomplished by injecting a small amount of toxin into the muscles of the neck and head. Dosage must be titrated for each individual. Any benefits resulting from botulism toxin tend to wear off after 3 months with a repeat injection required if benefit is to be maintained. Injections are usually done every 3-4 months with maximum effect peak achieved by about 2 or 3 weeks. Botulism toxin is expensive and you should be sure of what costs you will incur resulting from the injection.  The side effects of botulism toxin use for chronic migraine may include:   -Transient, and usually mild, facial weakness with facial injections  -Transient, and usually mild, head or neck weakness with head/neck injections  -Reduction or loss of forehead facial animation due to forehead muscle weakness  -Eyelid drooping  -Dry eye  -Pain at the site of injection or bruising at the site of injection  -Double  vision  -Potential unknown long term risks   Contraindications: You should not have Botox if you are pregnant, nursing, allergic to albumin, have an infection, skin condition, or muscle weakness at the site of the injection, or have myasthenia gravis, Lambert-Eaton syndrome, or ALS.  It is also possible that as with any injection, there may be an allergic reaction or no effect from the medication. Reduced effectiveness after repeated injections is sometimes seen and rarely infection at the injection site may occur. All care will be taken to prevent these side effects. If therapy is given over a long time, atrophy and wasting in the muscle injected may occur. Occasionally the patient's become refractory to treatment because they develop antibodies to the toxin. In this event, therapy needs to be modified.  I have read the above information and consent to the administration of botulism toxin.    BOTOX PROCEDURE NOTE FOR MIGRAINE HEADACHE  Contraindications and precautions discussed with patient(above). Aseptic procedure was observed and patient tolerated procedure. Procedure performed by Ihor Austin, AGNP-BC.   The condition has existed for more than 6 months, and pt does not have a diagnosis of ALS, Myasthenia Gravis or Lambert-Eaton Syndrome.  Risks and benefits of injections discussed and pt agrees to proceed with the procedure.  Written consent obtained  These injections are medically necessary. Pt  receives good benefits from these injections. These injections do not cause sedations or hallucinations which the oral therapies may cause.   Description of procedure:  The patient was placed in a sitting position. The standard protocol was used for Botox as follows, with 5 units of  Botox injected at each site:  -Procerus muscle, midline injection  -Corrugator muscle, bilateral injection  -Frontalis muscle, bilateral injection, with 2 sites each side, medial injection was performed in the  upper one third of the frontalis muscle, in the region vertical from the medial inferior edge of the superior orbital rim. The lateral injection was again in the upper one third of the forehead vertically above the lateral limbus of the cornea, 1.5 cm lateral to the medial injection site.  -Temporalis muscle injection, 4 sites, bilaterally. The first injection was 3 cm above the tragus of the ear, second injection site was 1.5 cm to 3 cm up from the first injection site in line with the tragus of the ear. The third injection site was 1.5-3 cm forward between the first 2 injection sites. The fourth injection site was 1.5 cm posterior to the second injection site. 5th site laterally in the temporalis  muscleat the level of the outer canthus.  -Occipitalis muscle injection, 3 sites, bilaterally. The first injection was done one half way between the occipital protuberance and the tip of the mastoid process behind the ear. The second injection site was done lateral and superior to the first, 1 fingerbreadth from the first injection. The third injection site was 1 fingerbreadth superiorly and medially from the first injection site.  -Cervical paraspinal muscle injection, 2 sites, bilaterally. The first injection site was 1 cm from the midline of the cervical spine, 3 cm inferior to the lower border of the occipital protuberance. The second injection site was 1.5 cm superiorly and laterally to the first injection site.  -Trapezius muscle injection was performed at 3 sites, bilaterally. The first injection site was in the upper trapezius muscle halfway between the inflection point of the neck, and the acromion. The second injection site was one half way between the acromion and the first injection site. The third injection was done between the first injection site and the inflection point of the neck.    A total of 200 units of Botox was prepared, 155 units of Botox was injected as documented above, any Botox  not injected was wasted. The patient tolerated the procedure well, there were no complications of the above procedure.   Ihor Austin, AGNP-BC  [facility] [address] [place], [place] [address]  Phone [phone] Fax [phone] Note: This document was prepared with digital dictation and possible smart phrase technology. Any transcriptional errors that result from this process are unintentional.

## 2023-03-11 LAB — EXTERNAL GENERIC LAB PROCEDURE: COLOGUARD: NEGATIVE

## 2023-03-11 LAB — COLOGUARD: COLOGUARD: NEGATIVE

## 2023-03-17 ENCOUNTER — Other Ambulatory Visit: Payer: Self-pay

## 2023-03-30 ENCOUNTER — Ambulatory Visit: Payer: PPO | Admitting: Adult Health

## 2023-03-31 ENCOUNTER — Encounter: Payer: Self-pay | Admitting: Adult Health

## 2023-04-24 ENCOUNTER — Other Ambulatory Visit: Payer: Self-pay | Admitting: Neurology

## 2023-04-24 ENCOUNTER — Other Ambulatory Visit: Payer: Self-pay

## 2023-04-24 DIAGNOSIS — G43709 Chronic migraine without aura, not intractable, without status migrainosus: Secondary | ICD-10-CM

## 2023-04-24 MED ORDER — BOTOX 200 UNITS IJ SOLR
INTRAMUSCULAR | 1 refills | Status: DC
  Filled 2023-04-24: qty 1, 90d supply, fill #0
  Filled 2023-07-27: qty 1, 90d supply, fill #1

## 2023-04-24 NOTE — Progress Notes (Signed)
 Specialty Pharmacy Refill Coordination Note  Mary [name] is a 53 y.o. female contacted today regarding refills of specialty medication(s) OnabotulinumtoxinA (Botox)   Patient requested Courier to Provider Office   Delivery date: 05/04/23   Verified address: [facility] [address], [place] [address]   Medication will be filled on 05/01/23, pending refill approval.

## 2023-04-26 NOTE — Progress Notes (Signed)
 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, history UPPP, asthma  (Aunt had narcolepsy), depression, obesity, NPSG 1999 AHI 4/ hr,  08/16/09 AHI 5.7/ hr MSLT 1999 mean latency 4 min, 2 SOREMs HLA-DQB1- 03/14/13- negative for genetic marker for narcolepsy [facility] gave modafanil 200, Vyvanse  70, ordered split PSG on 10/19/13- never done --------------------------------------------------------------------------------------------   05/15/2017- 70 yoF female never smoker followed for Narcolepsy without cataplexy, history UPPP, asthma     (Aunt had narcolepsy) Epworth 23 ----Narcolepsy without cataplexy: Pt states she is not doing well with staying awake. Pt is having weight loss surgery Weight today 244 lbs Modafinil  100 mg, Vyvanse  40 and 70 Some shortness of breath and occasional wheeze.  Would like rescue inhaler refilled, but only needed occasionally with no sleep disturbance. Has gained weight since previous sleep studies.  Continues to complain of oppressive daytime sleepiness without cataplexy.  Being considered for bariatric surgery and needs OSA status reassessed.  Stimulant medications are the best that she has found but still inadequate for help with daytime sleepiness.  They do not seem to disturb nighttime sleep.  04/27/23-  75 yoF female never smoker followed for Narcolepsy without cataplexy, history UPPP, asthma , hx Bariatric Surgery/gastric bypass,     (Aunt had narcolepsy) -Vyvanse  70 AM/ 40 afternoon Epworth score-20 Body weight today- -----Narcolepsy.  Would like to add medication on with Vyvance. Bedtime 9-10PM, latency30 minutes, WASO 4-5 times, up 8AM. No cataplexy or complex paraomnias. Occasional naps as needed. Mild asthma, intermittent uncomplicated. She asks for a letter supporting her effort to get a service dog to help keep her awake driving, etc. Not married, doubts snoring now.   ROS-see HPI   + = positive Constitutional:   No-   weight loss,  night sweats, fevers, chills,+ fatigue, lassitude. HEENT:   No-  headaches, difficulty swallowing, tooth/dental problems, sore throat,       + sneezing, itching, ear ache, +nasal congestion, post nasal drip,  CV:  No-   chest pain, orthopnea, PND, +swelling in lower extremities, anasarca, dizziness, palpitations Resp: +shortness of breath with exertion or at rest.              No-   productive cough,  No non-productive cough,  No- coughing up of blood.              No-   change in color of mucus.  No- wheezing.   Skin: No-   rash or lesions. GI:  No-   heartburn, indigestion, abdominal pain, nausea, vomiting,  GU:  MS:  No-   joint pain or swelling.   Neuro-     nothing unusual Psych:  No- change in mood or affect. + depression +anxiety.  No memory loss.  OBJ- Physical Exam  General- Alert, Oriented, Affect-appropriate, Distress- none physical. +slender Skin- rash-none, lesions- none, excoriation- none.  Lymphadenopathy- none Head- atraumatic            Eyes- Gross vision intact, PERRLA, conjunctivae and secretions clear            Ears- Hearing, canals-normal            Nose- Clear, no-Septal dev, mucus, polyps, erosion, perforation             Throat- Mallampati II/ UPPP, mucosa clear , drainage- none, tonsils- atrophic, +teeth Neck- flexible , trachea midline, no stridor , thyroid  nl, carotid no bruit Chest - symmetrical excursion , unlabored  Heart/CV- RRR , no murmur , no gallop  , no rub, nl s1 s2                           - JVD- none , edema- none, stasis changes- none, varices- none           Lung- clear to P&A, wheeze- none, cough- none , dullness-none, rub- none           Chest wall-  Abd-  Br/ Gen/ Rectal- Not done, not indicated Extrem- cyanosis- none, clubbing, none, atrophy- none, strength- nl Neuro- grossly intact to observation. No tremor. Not obviously sleepy. Seems emotionally engaged. Very appropriate interaction with Mary [name] daughter who comes with her  again today.

## 2023-04-27 ENCOUNTER — Encounter: Payer: Self-pay | Admitting: Internal Medicine

## 2023-04-27 ENCOUNTER — Ambulatory Visit (INDEPENDENT_AMBULATORY_CARE_PROVIDER_SITE_OTHER): Admitting: Internal Medicine

## 2023-04-27 VITALS — BP 114/66 | HR 76 | Temp 98.1°F | Ht 68.0 in | Wt 126.8 lb

## 2023-04-27 DIAGNOSIS — G47419 Narcolepsy without cataplexy: Secondary | ICD-10-CM

## 2023-04-27 DIAGNOSIS — Z9884 Bariatric surgery status: Secondary | ICD-10-CM

## 2023-04-27 DIAGNOSIS — Z681 Body mass index (BMI) 19 or less, adult: Secondary | ICD-10-CM | POA: Diagnosis not present

## 2023-04-27 MED ORDER — MODAFINIL 100 MG PO TABS: 100.0000 mg | ORAL_TABLET | Freq: Every day | ORAL | 1 refills | Status: DC

## 2023-04-27 NOTE — Patient Instructions (Signed)
 Script sent for modafinil to try. Start with 1 tab in the morning. You can increase to 2 tab ( no higher) if needed.  This is in addition to Vyvanse so watch out for overstimulation.  Let me hear from you after you have tried modafinil.

## 2023-04-28 ENCOUNTER — Telehealth: Payer: Self-pay

## 2023-04-28 NOTE — Telephone Encounter (Signed)
*  [facility]  Pharmacy Patient Advocate Encounter  Received notification from [facility] [employer] that Prior Authorization for Modafinil 100MG  tablets  has been APPROVED from 04/28/2023 to 04/27/2024   PA #/Case ID/Reference #: [id number]

## 2023-05-01 ENCOUNTER — Other Ambulatory Visit: Payer: Self-pay

## 2023-05-06 ENCOUNTER — Ambulatory Visit (INDEPENDENT_AMBULATORY_CARE_PROVIDER_SITE_OTHER): Payer: PPO | Admitting: Adult Health

## 2023-05-06 VITALS — BP 108/68 | HR 68

## 2023-05-06 DIAGNOSIS — G43709 Chronic migraine without aura, not intractable, without status migrainosus: Secondary | ICD-10-CM

## 2023-05-06 DIAGNOSIS — M542 Cervicalgia: Secondary | ICD-10-CM

## 2023-05-06 MED ORDER — ONABOTULINUMTOXINA 200 UNITS IJ SOLR
155.0000 [IU] | Freq: Once | INTRAMUSCULAR | Status: AC
Start: 2023-05-06 — End: 2023-05-06
  Administered 2023-05-06: 155 [IU] via INTRAMUSCULAR

## 2023-05-06 MED ORDER — BACLOFEN 10 MG PO TABS: 10.0000 mg | ORAL_TABLET | Freq: Three times a day (TID) | ORAL | 11 refills | Status: AC | PRN

## 2023-05-06 MED ORDER — VYEPTI 100 MG/ML IV SOLN: 100.0000 mg | INTRAVENOUS | 4 refills | Status: DC

## 2023-05-06 NOTE — Progress Notes (Signed)
 Update 05/06/2023 JM: Returns for repeat Botox. Prior injection 02/09/2023.  Reports worsening of migraine headaches since March, having about 3 to 4/week which has been causing increased neck pain.  After injection in January and for the month of February, migraines very well-controlled about once per month.  She has remained on [place] which was previously working well in addition to Botox but does not feel it is working any longer.  Has been using Ubrelvy as needed which she feels has been more beneficial than Nurtec.  She questions use of muscle relaxer to help with cervicalgia.  She is interested in trying to add Vyepti to alternate with Botox. Will start process today.  Advise to continue Qulipta until [place] initiated. Will return in 3 months for repeat injection.      Consent Form Botulism Toxin Injection For Chronic Migraine    Reviewed orally with patient, additionally signature is on file:  Botulism toxin has been approved by the Federal drug administration for treatment of chronic migraine. Botulism toxin does not cure chronic migraine and it may not be effective in some patients.  The administration of botulism toxin is accomplished by injecting a small amount of toxin into the muscles of the neck and head. Dosage must be titrated for each individual. Any benefits resulting from botulism toxin tend to wear off after 3 months with a repeat injection required if benefit is to be maintained. Injections are usually done every 3-4 months with maximum effect peak achieved by about 2 or 3 weeks. Botulism toxin is expensive and you should be sure of what costs you will incur resulting from the injection.  The side effects of botulism toxin use for chronic migraine may include:   -Transient, and usually mild, facial weakness with facial injections  -Transient, and usually mild, head or neck weakness with head/neck injections  -Reduction or loss of forehead facial animation due to  forehead muscle weakness  -Eyelid drooping  -Dry eye  -Pain at the site of injection or bruising at the site of injection  -Double vision  -Potential unknown long term risks   Contraindications: You should not have Botox if you are pregnant, nursing, allergic to albumin, have an infection, skin condition, or muscle weakness at the site of the injection, or have myasthenia gravis, Lambert-Eaton syndrome, or ALS.  It is also possible that as with any injection, there may be an allergic reaction or no effect from the medication. Reduced effectiveness after repeated injections is sometimes seen and rarely infection at the injection site may occur. All care will be taken to prevent these side effects. If therapy is given over a long time, atrophy and wasting in the muscle injected may occur. Occasionally the patient's become refractory to treatment because they develop antibodies to the toxin. In this event, therapy needs to be modified.  I have read the above information and consent to the administration of botulism toxin.    BOTOX PROCEDURE NOTE FOR MIGRAINE HEADACHE  Contraindications and precautions discussed with patient(above). Aseptic procedure was observed and patient tolerated procedure. Procedure performed by Ihor Austin, AGNP-BC.   The condition has existed for more than 6 months, and pt does not have a diagnosis of ALS, Myasthenia Gravis or Lambert-Eaton Syndrome.  Risks and benefits of injections discussed and pt agrees to proceed with the procedure.  Written consent obtained  These injections are medically necessary. Pt  receives good benefits from these injections. These injections do not cause sedations or hallucinations which  the oral therapies may cause.   Description of procedure:  The patient was placed in a sitting position. The standard protocol was used for Botox as follows, with 5 units of Botox injected at each site:  -Procerus muscle, midline  injection  -Corrugator muscle, bilateral injection  -Frontalis muscle, bilateral injection, with 2 sites each side, medial injection was performed in the upper one third of the frontalis muscle, in the region vertical from the medial inferior edge of the superior orbital rim. The lateral injection was again in the upper one third of the forehead vertically above the lateral limbus of the cornea, 1.5 cm lateral to the medial injection site.  -Temporalis muscle injection, 4 sites, bilaterally. The first injection was 3 cm above the tragus of the ear, second injection site was 1.5 cm to 3 cm up from the first injection site in line with the tragus of the ear. The third injection site was 1.5-3 cm forward between the first 2 injection sites. The fourth injection site was 1.5 cm posterior to the second injection site. 5th site laterally in the temporalis  muscleat the level of the outer canthus.  -Occipitalis muscle injection, 3 sites, bilaterally. The first injection was done one half way between the occipital protuberance and the tip of the mastoid process behind the ear. The second injection site was done lateral and superior to the first, 1 fingerbreadth from the first injection. The third injection site was 1 fingerbreadth superiorly and medially from the first injection site.  -Cervical paraspinal muscle injection, 2 sites, bilaterally. The first injection site was 1 cm from the midline of the cervical spine, 3 cm inferior to the lower border of the occipital protuberance. The second injection site was 1.5 cm superiorly and laterally to the first injection site.  -Trapezius muscle injection was performed at 3 sites, bilaterally. The first injection site was in the upper trapezius muscle halfway between the inflection point of the neck, and the acromion. The second injection site was one half way between the acromion and the first injection site. The third injection was done between the first injection  site and the inflection point of the neck.    A total of 200 units of Botox was prepared, 155 units of Botox was injected as documented above, any Botox not injected was wasted. The patient tolerated the procedure well, there were no complications of the above procedure.   Johny Nap, AGNP-BC  [facility] [address] [place], [place] [address]  Phone [phone] Fax [phone] Note: This document was prepared with digital dictation and possible smart phrase technology. Any transcriptional errors that result from this process are unintentional.

## 2023-05-06 NOTE — Progress Notes (Signed)
 Botox- 200 units x 1 vial Lot:[id number] Expiration:07/2025 NDC: 0023-3921-02   Bacteriostatic 0.9% Sodium Chloride- * mL  Lot: [id number] Expiration: 11/21/2023 NDC: 0454-0981-19   Dx: J47.829 S/P Witnessed by: Jeanie Miller, RN

## 2023-05-08 ENCOUNTER — Encounter: Payer: Self-pay | Admitting: Internal Medicine

## 2023-05-08 NOTE — Assessment & Plan Note (Signed)
 Successful bariatric surgery. Plan- she will continue dietary discipline.

## 2023-05-08 NOTE — Assessment & Plan Note (Signed)
 There is room to cautiously try adding modafinil  100-200 mg as discussed She will let us  know how it is doing. I agreed to provide support letter for service dog.

## 2023-05-12 ENCOUNTER — Telehealth: Payer: Self-pay | Admitting: Neurology

## 2023-05-12 ENCOUNTER — Ambulatory Visit: Payer: PPO | Admitting: Internal Medicine

## 2023-05-12 NOTE — Telephone Encounter (Signed)
 Vyepti  form completed and signed by Johny Nap, NP. Provided the form to  [facility]

## 2023-05-14 ENCOUNTER — Encounter: Payer: Self-pay | Admitting: Internal Medicine

## 2023-05-20 ENCOUNTER — Encounter: Payer: Self-pay | Admitting: Internal Medicine

## 2023-05-20 NOTE — Telephone Encounter (Signed)
 This letter is in CPod tray, ready for envelope and mail

## 2023-05-21 NOTE — Telephone Encounter (Signed)
 Per Dr. Linder Revere, mailed letter to patient.

## 2023-06-29 NOTE — Progress Notes (Unsigned)
 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  Would like to add medication on with Vyvance. Bedtime 9-10PM, latency30 minutes, WASO 4-5 times, up 8AM. No cataplexy or complex paraomnias. Occasional naps as needed. Mild asthma, intermittent uncomplicated. She asks for a letter supporting her effort to get a service dog to help keep her awake driving, etc. Not married, doubts snoring now.  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 Helped only initially. Discussed the use of AI scribe software for clinical note transcription with the patient, who gave verbal consent to proceed.  History of Present Illness   Mary [name] is a 53 year old female who presents with ongoing fatigue and sleep disturbances.  She experiences persistent fatigue, especially in the morning, lasting about three hours after waking. Despite taking Vyvanse  and modafinil , she often feels as though she hasn't slept. Tiredness occurs around 5 to 6 PM, and she sometimes falls asleep unintentionally. On weekends, she sleeps more and  tends to sleep in. She again denies cataplexy.  She takes Vyvanse  and modafinil  upon waking, currently on modafinil  100 mg, She denies any side effects from her medications and has not experienced heart rhythm problems. She finds nighttime cold medicines like NyQuil help her sleep well when she has a cold.     Assessment and Plan:    Narcolepsy without cataplexy Sleep disturbances with fatigue and accidental naps. Modafinil  100 mg with Vyvanse  currently used. Consider Sunosi,  Xywav as alternative to Xyrem due to dosing issues. No cardiac or cataplexy symptoms. - Increase modafinil  to 200 mg as needed, adjust between 100 mg and 200 mg based on response. - Consider Sunosi or Xywav trial if modafinil  adjustment fails.  Weight loss -looks thin Weight loss possibly linked to Vyvanse  use.     ROS-see HPI   + = positive Constitutional:   +weight loss, night sweats, fevers, chills,+ fatigue, lassitude. HEENT:   No-  headaches, difficulty swallowing, tooth/dental problems, sore throat,       + sneezing, itching, ear ache, +nasal congestion, post nasal drip,  CV:  No-   chest pain, orthopnea, PND, +swelling in lower extremities, anasarca, dizziness, palpitations Resp: +shortness of breath with exertion or at rest.              No-   productive cough,  No non-productive cough,  No- coughing up of blood.              No-   change in color of mucus.  No- wheezing.   Skin: No-   rash or lesions. GI:  No-   heartburn, indigestion, abdominal pain, nausea, vomiting,  GU:  MS:  No-   joint pain or swelling.   Neuro-  nothing unusual Psych:  No- change in mood or affect. + depression +anxiety.  No memory loss.  OBJ- Physical Exam  General- Alert, Oriented, Affect-appropriate, Distress- none physical. +slender Skin- rash-none, lesions- none, excoriation- none.  Lymphadenopathy- none Head- atraumatic            Eyes- Gross vision intact, PERRLA, conjunctivae and secretions clear            Ears-  Hearing, canals-normal            Nose- Clear, no-Septal dev, mucus, polyps, erosion, perforation             Throat- Mallampati II/ UPPP, mucosa clear , drainage- none, tonsils- atrophic, +teeth Neck- flexible , trachea midline, no stridor , thyroid  nl, carotid no bruit Chest - symmetrical excursion , unlabored           Heart/CV- RRR , no murmur , no gallop  , no rub, nl s1 s2                           - JVD- none , edema- none, stasis changes- none, varices- none           Lung- clear to P&A, wheeze- none, cough- none , dullness-none, rub- none           Chest wall-  Abd-  Br/ Gen/ Rectal- Not done, not indicated Extrem- cyanosis- none, clubbing, none, atrophy- none, strength- nl Neuro- grossly intact to observation. No tremor. Not obviously sleepy. Seems emotionally engaged.

## 2023-06-30 ENCOUNTER — Encounter: Payer: Self-pay | Admitting: Internal Medicine

## 2023-06-30 ENCOUNTER — Ambulatory Visit: Admitting: Internal Medicine

## 2023-06-30 VITALS — BP 99/63 | HR 64 | Ht 68.0 in | Wt 125.8 lb

## 2023-06-30 DIAGNOSIS — G47419 Narcolepsy without cataplexy: Secondary | ICD-10-CM | POA: Diagnosis not present

## 2023-06-30 MED ORDER — MODAFINIL 100 MG PO TABS: ORAL_TABLET | ORAL | 2 refills | Status: DC

## 2023-06-30 NOTE — Patient Instructions (Signed)
 We have increased modafinil  so you can try 1 to 2 tabs in the morning, along with Vyvanse , as needed for alertness.   Nap when you can.

## 2023-07-09 ENCOUNTER — Telehealth: Payer: Self-pay

## 2023-07-09 NOTE — Telephone Encounter (Signed)
 Fax from [facility] [address], [facility] requesting PA for Modafinil  100 mg tablet.  Note:  ALTERNATIVE REQUESTED:  INSURANCE MAX IS 30 PER 30 DAYS.  PA REQUIRED TO PROCESS.   Rx Prior Auth Team = Please advise.  Thank you!

## 2023-07-10 ENCOUNTER — Telehealth: Payer: Self-pay

## 2023-07-10 ENCOUNTER — Other Ambulatory Visit (HOSPITAL_COMMUNITY): Payer: Self-pay

## 2023-07-10 NOTE — Telephone Encounter (Signed)
 Insurance will only allow 30 tablets per 30 days- patient could switch to the 200mg  tablets and use 0.5-1 tablet daily if appropriate. This is covered with no additional PA needed.

## 2023-07-10 NOTE — Telephone Encounter (Signed)
*  Pulm  Pharmacy Patient Advocate Encounter   Received notification from Pt Calls Messages that prior authorization for Modafinil  100MG  tablets  is required/requested.   Insurance verification completed.   The patient is insured through [facility] ADVANTAGE/RX ADVANCE .   Per test claim:  30 tablets per 30 days is preferred by the insurance.  If suggested medication is appropriate, Please send in a new RX and discontinue this one. If not, please advise as to why it's not appropriate so that we may request a Prior Authorization. Please note, some preferred medications may still require a PA.  If the suggested medications have not been trialed and there are no contraindications to their use, the PA will not be submitted, as it will not be approved.   *suggested upping to the 200mg  tablets 0.5-1 tablet per day as this is covered

## 2023-07-10 NOTE — Telephone Encounter (Signed)
 Dr. Linder Revere, per your chart note from [facility] on 06/30/2023:  - Increase modafinil  to 200 mg as needed, adjust between 100 mg and 200 mg based on response.   Insurance will only pay for 30 tablets in a 30 day period.  Can patient switch to the 200mg  tablets and use 0.5-1 tablet daily if appropriate. This is covered with no additional PA needed.   Please advise.  Thank you.  Allergies  Allergen Reactions   Desvenlafaxine      headaches Other reaction(s): Other headaches headaches

## 2023-07-11 MED ORDER — MODAFINIL 200 MG PO TABS: ORAL_TABLET | ORAL | 5 refills | Status: DC

## 2023-07-11 NOTE — Telephone Encounter (Signed)
 Modafinil  script changed to 200 mg as requested

## 2023-07-23 ENCOUNTER — Other Ambulatory Visit: Payer: Self-pay

## 2023-07-27 ENCOUNTER — Other Ambulatory Visit (HOSPITAL_COMMUNITY): Payer: Self-pay

## 2023-07-28 ENCOUNTER — Other Ambulatory Visit: Payer: Self-pay

## 2023-07-28 NOTE — Progress Notes (Signed)
 Specialty Pharmacy Refill Coordination Note  Mary [name] is a 53 y.o. female contacted today regarding refills of specialty medication(s) OnabotulinumtoxinA  (Botox )   Patient requested Courier to Provider Office   Delivery date: 07/30/23   Verified address: [facility] [address], [place] [address]   Medication will be filled on 07/29/23.   Patient approved $12.15 copay to cc on file.

## 2023-08-04 ENCOUNTER — Ambulatory Visit: Admitting: Adult Health

## 2023-08-04 VITALS — BP 112/67 | HR 77

## 2023-08-04 DIAGNOSIS — G43709 Chronic migraine without aura, not intractable, without status migrainosus: Secondary | ICD-10-CM

## 2023-08-04 MED ORDER — QULIPTA 60 MG PO TABS: 60.0000 mg | ORAL_TABLET | Freq: Every day | ORAL | 3 refills | Status: DC

## 2023-08-04 MED ORDER — ONABOTULINUMTOXINA 200 UNITS IJ SOLR
155.0000 [IU] | Freq: Once | INTRAMUSCULAR | Status: AC
  Administered 2023-08-04: 155 [IU] via INTRAMUSCULAR

## 2023-08-04 MED ORDER — UBRELVY 100 MG PO TABS: 100.0000 mg | ORAL_TABLET | ORAL | 11 refills | Status: AC | PRN

## 2023-08-04 NOTE — Progress Notes (Signed)
 Botox - 200 units x 1 vial Lot: [id number] Expiration: 12/2025 NDC: 9976-6078-97  Bacteriostatic 0.9% Sodium Chloride - 4mL total Onu:[id number] Expiration: 11/19/2024 NDC: 9590-8033-97  Dx: g43.709 SP  Witnessed by Augustin NOVAK

## 2023-08-04 NOTE — Progress Notes (Signed)
 Update 08/04/2023 JM: Returns for repeat Botox . Prior injection 05/06/2023.  Reports overall improvement of migraine headaches after last injection.  At prior injection, reported about 3-4 migraines per week but after April injection, has been experiencing about 1/week. Was not able to pursue Vyepti  due to high cost.  She has remained on Qulipta  60mg  daily as well as Ubrelvy  as needed with benefit.  Use of baclofen  as needed for cervicalgia.  Tolerated procedure well today.  Will return in 3 months for repeat injection.      Consent Form Botulism Toxin Injection For Chronic Migraine    Reviewed orally with patient, additionally signature is on file:  Botulism toxin has been approved by the Federal drug administration for treatment of chronic migraine. Botulism toxin does not cure chronic migraine and it may not be effective in some patients.  The administration of botulism toxin is accomplished by injecting a small amount of toxin into the muscles of the neck and head. Dosage must be titrated for each individual. Any benefits resulting from botulism toxin tend to wear off after 3 months with a repeat injection required if benefit is to be maintained. Injections are usually done every 3-4 months with maximum effect peak achieved by about 2 or 3 weeks. Botulism toxin is expensive and you should be sure of what costs you will incur resulting from the injection.  The side effects of botulism toxin use for chronic migraine may include:   -Transient, and usually mild, facial weakness with facial injections  -Transient, and usually mild, head or neck weakness with head/neck injections  -Reduction or loss of forehead facial animation due to forehead muscle weakness  -Eyelid drooping  -Dry eye  -Pain at the site of injection or bruising at the site of injection  -Double vision  -Potential unknown long term risks   Contraindications: You should not have Botox  if you are pregnant, nursing,  allergic to albumin, have an infection, skin condition, or muscle weakness at the site of the injection, or have myasthenia gravis, Lambert-Eaton syndrome, or ALS.  It is also possible that as with any injection, there may be an allergic reaction or no effect from the medication. Reduced effectiveness after repeated injections is sometimes seen and rarely infection at the injection site may occur. All care will be taken to prevent these side effects. If therapy is given over a long time, atrophy and wasting in the muscle injected may occur. Occasionally the patient's become refractory to treatment because they develop antibodies to the toxin. In this event, therapy needs to be modified.  I have read the above information and consent to the administration of botulism toxin.    BOTOX  PROCEDURE NOTE FOR MIGRAINE HEADACHE  Contraindications and precautions discussed with patient(above). Aseptic procedure was observed and patient tolerated procedure. Procedure performed by Harlene Bogaert, AGNP-BC.   The condition has existed for more than 6 months, and pt does not have a diagnosis of ALS, Myasthenia Gravis or Lambert-Eaton Syndrome.  Risks and benefits of injections discussed and pt agrees to proceed with the procedure.  Written consent obtained  These injections are medically necessary. Pt  receives good benefits from these injections. These injections do not cause sedations or hallucinations which the oral therapies may cause.   Description of procedure:  The patient was placed in a sitting position. The standard protocol was used for Botox  as follows, with 5 units of Botox  injected at each site:  -Procerus muscle, midline injection  -Corrugator muscle,  bilateral injection  -Frontalis muscle, bilateral injection, with 2 sites each side, medial injection was performed in the upper one third of the frontalis muscle, in the region vertical from the medial inferior edge of the superior orbital rim.  The lateral injection was again in the upper one third of the forehead vertically above the lateral limbus of the cornea, 1.5 cm lateral to the medial injection site.  -Temporalis muscle injection, 4 sites, bilaterally. The first injection was 3 cm above the tragus of the ear, second injection site was 1.5 cm to 3 cm up from the first injection site in line with the tragus of the ear. The third injection site was 1.5-3 cm forward between the first 2 injection sites. The fourth injection site was 1.5 cm posterior to the second injection site. 5th site laterally in the temporalis  muscleat the level of the outer canthus.  -Occipitalis muscle injection, 3 sites, bilaterally. The first injection was done one half way between the occipital protuberance and the tip of the mastoid process behind the ear. The second injection site was done lateral and superior to the first, 1 fingerbreadth from the first injection. The third injection site was 1 fingerbreadth superiorly and medially from the first injection site.  -Cervical paraspinal muscle injection, 2 sites, bilaterally. The first injection site was 1 cm from the midline of the cervical spine, 3 cm inferior to the lower border of the occipital protuberance. The second injection site was 1.5 cm superiorly and laterally to the first injection site.  -Trapezius muscle injection was performed at 3 sites, bilaterally. The first injection site was in the upper trapezius muscle halfway between the inflection point of the neck, and the acromion. The second injection site was one half way between the acromion and the first injection site. The third injection was done between the first injection site and the inflection point of the neck.    A total of 200 units of Botox  was prepared, 155 units of Botox  was injected as documented above, any Botox  not injected was wasted. The patient tolerated the procedure well, there were no complications of the above  procedure.   Harlene Bogaert, AGNP-BC  [facility] [address] [place], [place] [address]  Phone [phone] Fax [phone] Note: This document was prepared with digital dictation and possible smart phrase technology. Any transcriptional errors that result from this process are unintentional.

## 2023-08-31 ENCOUNTER — Ambulatory Visit: Admitting: Internal Medicine

## 2023-09-01 ENCOUNTER — Ambulatory Visit: Admitting: Internal Medicine

## 2023-09-24 ENCOUNTER — Other Ambulatory Visit (HOSPITAL_COMMUNITY): Payer: Self-pay

## 2023-10-09 ENCOUNTER — Other Ambulatory Visit: Payer: Self-pay

## 2023-10-09 ENCOUNTER — Other Ambulatory Visit: Payer: Self-pay | Admitting: Neurology

## 2023-10-09 DIAGNOSIS — G43709 Chronic migraine without aura, not intractable, without status migrainosus: Secondary | ICD-10-CM

## 2023-10-09 MED ORDER — BOTOX 200 UNITS IJ SOLR
INTRAMUSCULAR | 1 refills | Status: DC
  Filled 2023-10-09: qty 1, fill #0
  Filled 2023-10-27: qty 1, 90d supply, fill #0

## 2023-10-13 ENCOUNTER — Other Ambulatory Visit: Payer: Self-pay

## 2023-10-27 ENCOUNTER — Ambulatory Visit: Admitting: Internal Medicine

## 2023-10-27 ENCOUNTER — Other Ambulatory Visit: Payer: Self-pay

## 2023-10-27 NOTE — Progress Notes (Signed)
 Specialty Pharmacy Refill Coordination Note  Mary [name] is a 53 y.o. female assessed today regarding refills of clinic administered specialty medication(s) OnabotulinumtoxinA  (Botox )   Clinic requested Courier to Provider Office   Delivery date: 10/29/23   Verified address: [facility] [address], [place] [address]   Medication will be filled on 10.08.25.    Appointment: 10.14.25 Copay: $0.00

## 2023-10-28 ENCOUNTER — Other Ambulatory Visit: Payer: Self-pay

## 2023-11-03 ENCOUNTER — Encounter: Payer: Self-pay | Admitting: Adult Health

## 2023-11-03 ENCOUNTER — Ambulatory Visit (INDEPENDENT_AMBULATORY_CARE_PROVIDER_SITE_OTHER): Admitting: Adult Health

## 2023-11-03 VITALS — BP 121/68 | HR 60

## 2023-11-03 DIAGNOSIS — G43709 Chronic migraine without aura, not intractable, without status migrainosus: Secondary | ICD-10-CM

## 2023-11-03 MED ORDER — ONDANSETRON 4 MG PO TBDP
4.0000 mg | ORAL_TABLET | Freq: Three times a day (TID) | ORAL | 11 refills | Status: AC | PRN
Start: 2023-11-03 — End: ?

## 2023-11-03 MED ORDER — KETOROLAC TROMETHAMINE 60 MG/2ML IM SOLN
60.0000 mg | Freq: Once | INTRAMUSCULAR | Status: AC
  Administered 2023-11-03: 60 mg via INTRAMUSCULAR

## 2023-11-03 MED ORDER — ONABOTULINUMTOXINA 200 UNITS IJ SOLR
155.0000 [IU] | Freq: Once | INTRAMUSCULAR | Status: AC
  Administered 2023-11-03: 155 [IU] via INTRAMUSCULAR

## 2023-11-03 NOTE — Progress Notes (Signed)
 Update 11/03/2023 JM: Returns for repeat Botox . Prior injection 08/04/2023.  Migraines remain well-controlled with Botox  with >50% migraine reduction in frequency and severity.  She can have wearing off about 2 weeks prior to next injection.  She has a severe migraine today with associated nausea.  She was prescribed Toradol  injection (no recent use of NSAID, no kidney issue) and will rx Zofran  as needed. She will continue Qulipta  60mg  daily and use of Ubrelvy  as needed.  Use of baclofen  as needed for cervicalgia.  Tolerated procedure well today.        Consent Form Botulism Toxin Injection For Chronic Migraine    Reviewed orally with patient, additionally signature is on file:  Botulism toxin has been approved by the Federal drug administration for treatment of chronic migraine. Botulism toxin does not cure chronic migraine and it may not be effective in some patients.  The administration of botulism toxin is accomplished by injecting a small amount of toxin into the muscles of the neck and head. Dosage must be titrated for each individual. Any benefits resulting from botulism toxin tend to wear off after 3 months with a repeat injection required if benefit is to be maintained. Injections are usually done every 3-4 months with maximum effect peak achieved by about 2 or 3 weeks. Botulism toxin is expensive and you should be sure of what costs you will incur resulting from the injection.  The side effects of botulism toxin use for chronic migraine may include:   -Transient, and usually mild, facial weakness with facial injections  -Transient, and usually mild, head or neck weakness with head/neck injections  -Reduction or loss of forehead facial animation due to forehead muscle weakness  -Eyelid drooping  -Dry eye  -Pain at the site of injection or bruising at the site of injection  -Double vision  -Potential unknown long term risks   Contraindications: You should not have Botox  if  you are pregnant, nursing, allergic to albumin, have an infection, skin condition, or muscle weakness at the site of the injection, or have myasthenia gravis, Lambert-Eaton syndrome, or ALS.  It is also possible that as with any injection, there may be an allergic reaction or no effect from the medication. Reduced effectiveness after repeated injections is sometimes seen and rarely infection at the injection site may occur. All care will be taken to prevent these side effects. If therapy is given over a long time, atrophy and wasting in the muscle injected may occur. Occasionally the patient's become refractory to treatment because they develop antibodies to the toxin. In this event, therapy needs to be modified.  I have read the above information and consent to the administration of botulism toxin.    BOTOX  PROCEDURE NOTE FOR MIGRAINE HEADACHE  Contraindications and precautions discussed with patient(above). Aseptic procedure was observed and patient tolerated procedure. Procedure performed by Harlene Bogaert, AGNP-BC.   The condition has existed for more than 6 months, and pt does not have a diagnosis of ALS, Myasthenia Gravis or Lambert-Eaton Syndrome.  Risks and benefits of injections discussed and pt agrees to proceed with the procedure.  Written consent obtained  These injections are medically necessary. Pt  receives good benefits from these injections. These injections do not cause sedations or hallucinations which the oral therapies may cause.   Description of procedure:  The patient was placed in a sitting position. The standard protocol was used for Botox  as follows, with 5 units of Botox  injected at each site:  -  Procerus muscle, midline injection  -Corrugator muscle, bilateral injection  -Frontalis muscle, bilateral injection, with 2 sites each side, medial injection was performed in the upper one third of the frontalis muscle, in the region vertical from the medial inferior edge of  the superior orbital rim. The lateral injection was again in the upper one third of the forehead vertically above the lateral limbus of the cornea, 1.5 cm lateral to the medial injection site.  -Temporalis muscle injection, 4 sites, bilaterally. The first injection was 3 cm above the tragus of the ear, second injection site was 1.5 cm to 3 cm up from the first injection site in line with the tragus of the ear. The third injection site was 1.5-3 cm forward between the first 2 injection sites. The fourth injection site was 1.5 cm posterior to the second injection site. 5th site laterally in the temporalis  muscleat the level of the outer canthus.  -Occipitalis muscle injection, 3 sites, bilaterally. The first injection was done one half way between the occipital protuberance and the tip of the mastoid process behind the ear. The second injection site was done lateral and superior to the first, 1 fingerbreadth from the first injection. The third injection site was 1 fingerbreadth superiorly and medially from the first injection site.  -Cervical paraspinal muscle injection, 2 sites, bilaterally. The first injection site was 1 cm from the midline of the cervical spine, 3 cm inferior to the lower border of the occipital protuberance. The second injection site was 1.5 cm superiorly and laterally to the first injection site.  -Trapezius muscle injection was performed at 3 sites, bilaterally. The first injection site was in the upper trapezius muscle halfway between the inflection point of the neck, and the acromion. The second injection site was one half way between the acromion and the first injection site. The third injection was done between the first injection site and the inflection point of the neck.    A total of 200 units of Botox  was prepared, 155 units of Botox  was injected as documented above, any Botox  not injected was wasted. The patient tolerated the procedure well, there were no complications of  the above procedure.   Harlene Bogaert, AGNP-BC  [facility] [address] [place], [place] [address]  Phone [phone] Fax [phone] Note: This document was prepared with digital dictation and possible smart phrase technology. Any transcriptional errors that result from this process are unintentional.

## 2023-11-03 NOTE — Progress Notes (Signed)
 Botox - 200 units x 1 vial Lot: [id number] Expiration: 11/2025 NDC: 9976-6078-97   Bacteriostatic 0.9% Sodium Chloride - 4 mL  Lot: [id number] Expiration: 11/19/2024 NDC: 9590-8033-97    Dx: H56.290 Sp Witness: Rojean DEL      Orders given from NP for Ketorolac  30mg   Pt verified by name and DOB  Explain procedure to pt,  ketorolac  30mg  given in the L Upper outer quadrant of gluteus maximus.   Pt advised to wait 15 min's after injection, advised to call office with any concerns  Pt tolerated well and verbally understood.

## 2023-11-05 ENCOUNTER — Telehealth: Payer: Self-pay | Admitting: Adult Health

## 2023-11-05 DIAGNOSIS — G43709 Chronic migraine without aura, not intractable, without status migrainosus: Secondary | ICD-10-CM

## 2023-11-05 NOTE — Telephone Encounter (Signed)
 Completed auth request for every 10 weeks and faxed to [phone].

## 2023-11-05 NOTE — Telephone Encounter (Signed)
-----   Message from Harlene Bogaert sent at 11/03/2023 10:26 AM EDT ----- Can you see if insurance will approve her getting botox  every 10 weeks due to wearing off prior to 12 weeks. Thank you!

## 2023-11-11 ENCOUNTER — Other Ambulatory Visit: Payer: Self-pay

## 2023-11-11 MED ORDER — BOTOX 200 UNITS IJ SOLR
INTRAMUSCULAR | 3 refills | Status: AC
  Filled 2023-11-11: qty 1, fill #0
  Filled 2024-01-22: qty 1, 70d supply, fill #0

## 2023-11-11 NOTE — Telephone Encounter (Signed)
 Noted! Thank you

## 2023-11-11 NOTE — Telephone Encounter (Addendum)
 Mary [name]- I called [employer]TA to check the status of auth, spoke with Ricka. She states shara was approved, cannot be filled until after 01/05/24. She tried a test claim under a 70 day supply instead of 90 and it worked. Auth#: [id number] (11/05/23-01/20/24)  Pod- Please cancel old rx with [facility] and resend as every 10 weeks instead of 12, thank you.

## 2023-11-11 NOTE — Telephone Encounter (Signed)
 Botox  200 unit Rx sent to [facility] for 155 units q10 weeks and I canceled the 12 week Rx per v.o. JM NP.

## 2023-11-11 NOTE — Addendum Note (Signed)
 Addended by: HILLIARD HEATHER CROME on: 11/11/2023 01:37 PM   Modules accepted: Orders

## 2023-11-13 ENCOUNTER — Telehealth: Payer: Self-pay | Admitting: Physician Assistant

## 2023-11-13 NOTE — Telephone Encounter (Addendum)
 Copied from CRM [id number]. Topic: Clinical - Medication Refill >> Nov 13, 2023 12:32 PM Nathanel DEL wrote: Medication: modafinil  (PROVIGIL ) 200 MG tablet albuterol  (PROVENTIL  HFA;VENTOLIN  HFA) 108 (90 Base) MCG/ACT inhaler  Has the patient contacted their pharmacy? No she says she must call in the Modafinil  200 mg, and the inhaler is from 2019  This is the patient's preferred pharmacy:  [id number] [address], [place] - [address] [place] [address] Phone: [phone] Fax: [phone]   Is this the correct pharmacy for this prescription? Yes If no, delete pharmacy and type the correct one.   Has the prescription been filled recently? Yes  Is the patient out of the medication? No  Has the patient been seen for an appointment in the last year OR does the patient have an upcoming appointment? Yes  Can we respond through MyChart? Yes  Agent: Please be advised that Rx refills may take up to 3 business days. We ask that you follow-up with your pharmacy.

## 2023-11-16 ENCOUNTER — Other Ambulatory Visit: Payer: Self-pay | Admitting: Internal Medicine

## 2023-11-16 MED ORDER — MODAFINIL 200 MG PO TABS: ORAL_TABLET | ORAL | 5 refills | Status: AC

## 2023-11-16 MED ORDER — MODAFINIL 200 MG PO TABS: ORAL_TABLET | ORAL | 5 refills | Status: DC

## 2023-11-16 NOTE — Telephone Encounter (Signed)
 Patient is requesting refill on provigil 

## 2023-12-09 NOTE — Progress Notes (Signed)
 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 Helped only initially. Discussed the use of AI scribe software for clinical note transcription with the patient, who gave verbal consent to proceed.  History of Present Illness   Jalise Zawistowski is a 53 year old female who presents with ongoing fatigue and sleep disturbances.  She experiences persistent fatigue, especially in the morning, lasting about three hours after waking. Despite taking Vyvanse  and modafinil , she often feels as though she hasn't slept. Tiredness occurs around 5 to 6 PM, and she sometimes falls asleep unintentionally. On weekends, she sleeps more and tends to sleep in. She again denies cataplexy.  She takes Vyvanse  and modafinil  upon waking, currently on modafinil  100 mg, She denies any side effects from her medications and has not experienced heart rhythm problems. She finds nighttime cold medicines like NyQuil help her sleep well when she has a cold.     Assessment and Plan:    Narcolepsy without cataplexy Sleep disturbances with fatigue and accidental naps. Modafinil  100 mg with Vyvanse  currently used. Consider Sunosi,  Xywav as alternative to Xyrem due to dosing issues. No cardiac or cataplexy symptoms. - Increase modafinil  to 200 mg as  needed, adjust between 100 mg and 200 mg based on response. - Consider Sunosi or Xywav trial if modafinil  adjustment fails.  Weight loss -looks thin Weight loss possibly linked to Vyvanse  use.    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  History of Present Illness   Smt Lokey is a 53 year old female who presents with persistent sleepiness despite medication adjustments.  She has ongoing excessive daytime sleepiness despite increasing modafinil  to 200 mg. It initially helped but has lost effect. She has tried Adderall and Ritalin  without benefit. Vyvanse  gives partial and inconsistent benefit without adverse effects. She feels very tired, especially after naps, and has difficulty getting up. Afraid to nap because she can't wake before sleeping several hours. 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 Sleeps alone with children in home.  She had palatoplasty in the past for snoring and concern for sleep apnea when she was heavier. Her last sleep study was in 1999. She and her daughter note no current snoring except when she is congested.  She uses an albuterol  inhaler as needed for shortness of breath, particularly with physical activity at work, and requests a refill.      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 Vyvanse  effective but variable. Previous trials of Adderall and Ritalin  ineffective. No adverse effects from Vyvanse .  Sleep quality may influence medication efficacy. - Ordered home sleep test to evaluate for sleep apnea. - Continue Vyvanse   as prescribed by primary care physician. - Encouraged short naps to improve alertness. -After HST, consider Sunosi or Lumryz.  Suspected sleep apnea (pending evaluation) Suspected sleep apnea due to past snoring and weight changes. No recent sleep studies since 1999. - Ordered home sleep test to evaluate for sleep apnea.  Asthma- mild intermittent, uncomplicated Intermittent asthma symptoms managed with albuterol  inhaler. Symptoms occur with physical exertion or viral illness. - Refilled albuterol  inhaler.     ROS-see HPI   + = positive Constitutional:   +weight loss, night sweats, fevers, chills,+ fatigue, lassitude. HEENT:   No-  headaches, difficulty swallowing, tooth/dental problems, sore throat,       + sneezing, itching, ear ache, +nasal congestion, post nasal drip,  CV:  No-   chest pain, orthopnea, PND, +swelling in lower extremities, anasarca, dizziness, palpitations Resp: +shortness of breath with exertion or at rest.              No-   productive cough,  No non-productive cough,  No- coughing up of blood.              No-   change in color of mucus.  No- wheezing.   Skin: No-   rash or lesions. GI:  No-   heartburn, indigestion, abdominal pain, nausea, vomiting,  GU:  MS:  No-   joint pain or swelling.   Neuro-     nothing unusual Psych:  No- change in mood or affect. + depression +anxiety.  No memory loss.  OBJ- Physical Exam  General- Alert, Oriented, Affect-appropriate, Distress- none physical. +slender Skin- rash-none, lesions- none, excoriation- none.  Lymphadenopathy- none Head- atraumatic            Eyes- Gross vision intact, PERRLA, conjunctivae and secretions clear            Ears- Hearing, canals-normal            Nose- Clear, no-Septal dev, mucus, polyps, erosion, perforation             Throat- Mallampati II/ UPPP, mucosa clear , drainage- none, tonsils- atrophic, +teeth Neck- flexible , trachea midline, no stridor , thyroid  nl, carotid no bruit Chest -  symmetrical excursion , unlabored           Heart/CV- RRR , no murmur , no gallop  , no rub, nl s1 s2                           - JVD- none , edema- none, stasis changes- none, varices- none           Lung- clear to P&A, wheeze- none, cough- none , dullness-none, rub- none           Chest wall-  Abd-  Br/ Gen/ Rectal- Not done, not indicated Extrem- cyanosis- none, clubbing, none, atrophy- none, strength- nl Neuro- grossly intact to observation. No tremor. Not obviously sleepy. Seems emotionally engaged.

## 2023-12-10 ENCOUNTER — Encounter: Payer: Self-pay | Admitting: Internal Medicine

## 2023-12-10 ENCOUNTER — Other Ambulatory Visit (HOSPITAL_COMMUNITY): Payer: Self-pay

## 2023-12-10 ENCOUNTER — Telehealth: Payer: Self-pay

## 2023-12-10 ENCOUNTER — Ambulatory Visit (INDEPENDENT_AMBULATORY_CARE_PROVIDER_SITE_OTHER): Admitting: Internal Medicine

## 2023-12-10 VITALS — BP 108/58 | HR 70 | Temp 98.4°F | Ht 68.0 in | Wt 125.2 lb

## 2023-12-10 DIAGNOSIS — G47419 Narcolepsy without cataplexy: Secondary | ICD-10-CM

## 2023-12-10 DIAGNOSIS — G4710 Hypersomnia, unspecified: Secondary | ICD-10-CM | POA: Diagnosis not present

## 2023-12-10 MED ORDER — ALBUTEROL SULFATE HFA 108 (90 BASE) MCG/ACT IN AERS: 2.0000 | INHALATION_SPRAY | RESPIRATORY_TRACT | 12 refills | Status: AC | PRN

## 2023-12-10 NOTE — Patient Instructions (Addendum)
 Order- schedule home sleep test dx excessive somnolence  Please call for results and recommendations about 2 weeks after your sleep test  [facility] to continue Vyvanse  per your primary care provider  Try to take short naps when you can.  Be safe driving!!!  We suggested you might like Dr Jude as your next sleep doctor. Ask checkout to bring you back with him. You and he can talk about alternative meds like Sunosi or Lumryz,

## 2023-12-12 ENCOUNTER — Encounter: Payer: Self-pay | Admitting: Internal Medicine

## 2023-12-15 ENCOUNTER — Other Ambulatory Visit (HOSPITAL_COMMUNITY): Payer: Self-pay

## 2023-12-18 ENCOUNTER — Other Ambulatory Visit: Payer: Self-pay | Admitting: Adult Health

## 2023-12-21 ENCOUNTER — Other Ambulatory Visit (HOSPITAL_COMMUNITY): Payer: Self-pay

## 2023-12-21 NOTE — Telephone Encounter (Signed)
 Pharmacy Patient Advocate Encounter   Received notification from CoverMyMeds that prior authorization for Ondansetron  4MG  Dispersible Tablets is required/requested.   Insurance verification completed.   The patient is insured through [facility] ADVANTAGE/RX ADVANCE.   Per test claim: PA required; PA started via CoverMyMeds. KEY [id number] . Waiting for clinical questions to populate.

## 2023-12-23 ENCOUNTER — Other Ambulatory Visit (HOSPITAL_COMMUNITY): Payer: Self-pay

## 2023-12-23 NOTE — Telephone Encounter (Signed)
   I called [employer] and was placed on Hold 4 times and had to disconnect for a meeting. Will attempt to call back at a later time or find paper form to submit.

## 2023-12-24 NOTE — Telephone Encounter (Signed)
 Faxed completed clinical questions and clinical notes to HTA at [phone].

## 2023-12-25 NOTE — Telephone Encounter (Signed)
 Pharmacy Patient Advocate Encounter  Received notification from [employer] that Prior Authorization for Ondansetron  has been DENIED.  Full denial letter will be uploaded to the media tab. See denial reason below.   PA #/Case ID/Reference #: N/A

## 2023-12-28 ENCOUNTER — Ambulatory Visit: Admitting: Neurology

## 2023-12-29 NOTE — Telephone Encounter (Signed)
 LVM (ok per DPR) informing pt that Ondansetron  was denied by insurance but she can still get it from the pharmacy with a good rx coupon and pay discounted cash price outside of insurance. Left office number for call back if needed.

## 2024-01-04 ENCOUNTER — Telehealth: Payer: Self-pay | Admitting: *Deleted

## 2024-01-04 ENCOUNTER — Encounter

## 2024-01-04 NOTE — Telephone Encounter (Signed)
 LVM for patient to call regarding 01/04/24 cancelled HST appointment at [facility] [address]---office is closed due to maintenance issues

## 2024-01-04 NOTE — Telephone Encounter (Signed)
 LVM for patient to call regarding the 4:00pm HST appointment today  01/04/24---appt cancelled due to maintenance issues at the [employer] office

## 2024-01-04 NOTE — Telephone Encounter (Signed)
 LVM for patient stating appointment has been cancelled due to maintenance issues--someone will call back tomorrow 01/05/24 to reschedule the HST appointment----requested return confirmation call kf

## 2024-01-12 ENCOUNTER — Encounter

## 2024-01-18 ENCOUNTER — Other Ambulatory Visit: Payer: Self-pay

## 2024-01-19 ENCOUNTER — Other Ambulatory Visit (HOSPITAL_COMMUNITY): Payer: Self-pay

## 2024-01-22 ENCOUNTER — Other Ambulatory Visit: Payer: Self-pay

## 2024-01-22 NOTE — Progress Notes (Signed)
 Specialty Pharmacy Refill Coordination Note  Mary [name] is a 54 y.o. female contacted today regarding refills of specialty medication(s) OnabotulinumtoxinA  (Botox )   Patient requested Courier to Provider Office   Delivery date: 01/26/24   Verified address: [facility] [address], [place] [address]   Medication will be filled on: 01/25/24

## 2024-01-25 ENCOUNTER — Other Ambulatory Visit: Payer: Self-pay

## 2024-01-27 ENCOUNTER — Telehealth: Payer: Self-pay | Admitting: Adult Health

## 2024-01-27 NOTE — Telephone Encounter (Signed)
 I called the main phone # on pt's HTA card because a call back # wasn't provided, I also wasn't notified when they were on the phone to pick up call. Transferred to correct department and gave the needed information, auth still pending.

## 2024-01-27 NOTE — Telephone Encounter (Signed)
 Is this something you can handle?  It looks like health team advantage calling with some clinical questions for Botox  approval.

## 2024-01-27 NOTE — Telephone Encounter (Signed)
 I called [employer]TA because CMM won't allow me to submit PA stating that she was already approved. Spoke with Duwaine, he did see that PA expired 01/20/24. He initiated one over the phone with pending case # [id number], faxed clinical notes to [phone].

## 2024-01-27 NOTE — Telephone Encounter (Signed)
 Ritika calling in regards Botox  for authorization have a couple of clinical questions.  Would like a call back

## 2024-01-28 NOTE — Telephone Encounter (Signed)
 Auth#: [id number] (01/27/24-01/26/25)

## 2024-02-01 ENCOUNTER — Encounter: Payer: Self-pay | Admitting: Adult Health

## 2024-02-01 ENCOUNTER — Ambulatory Visit: Admitting: Adult Health

## 2024-02-01 VITALS — BP 116/74 | HR 74

## 2024-02-01 DIAGNOSIS — G43709 Chronic migraine without aura, not intractable, without status migrainosus: Secondary | ICD-10-CM | POA: Diagnosis not present

## 2024-02-01 MED ORDER — QULIPTA 60 MG PO TABS: 60.0000 mg | ORAL_TABLET | Freq: Every day | ORAL | 3 refills | Status: AC

## 2024-02-01 MED ORDER — ONABOTULINUMTOXINA 200 UNITS IJ SOLR
155.0000 [IU] | Freq: Once | INTRAMUSCULAR | Status: AC
  Administered 2024-02-01: 155 [IU] via INTRAMUSCULAR

## 2024-02-01 NOTE — Progress Notes (Signed)
 "    Update 02/01/2024 JM: Returns for repeat Botox . Prior injection 11/03/2023.  Migraines remain well-controlled with Botox  with >50% migraine reduction in frequency and severity. She is now receiving Botox  every 10 weeks due to wearing off with significant migraines with a 12-week duration.  She has been having difficulty getting Qulipta  refills and has been out over the past week with noticeable increased migraines.  Updated order provided in order to complete PA, she was provided samples during the interval time. Use of Ubrelvy  as needed.  Use of baclofen  as needed for cervicalgia.  Tolerated procedure well today.        Consent Form Botulism Toxin Injection For Chronic Migraine    Reviewed orally with patient, additionally signature is on file:  Botulism toxin has been approved by the Federal drug administration for treatment of chronic migraine. Botulism toxin does not cure chronic migraine and it may not be effective in some patients.  The administration of botulism toxin is accomplished by injecting a small amount of toxin into the muscles of the neck and head. Dosage must be titrated for each individual. Any benefits resulting from botulism toxin tend to wear off after 3 months with a repeat injection required if benefit is to be maintained. Injections are usually done every 3-4 months with maximum effect peak achieved by about 2 or 3 weeks. Botulism toxin is expensive and you should be sure of what costs you will incur resulting from the injection.  The side effects of botulism toxin use for chronic migraine may include:   -Transient, and usually mild, facial weakness with facial injections  -Transient, and usually mild, head or neck weakness with head/neck injections  -Reduction or loss of forehead facial animation due to forehead muscle weakness  -Eyelid drooping  -Dry eye  -Pain at the site of injection or bruising at the site of injection  -Double vision  -Potential unknown  long term risks   Contraindications: You should not have Botox  if you are pregnant, nursing, allergic to albumin, have an infection, skin condition, or muscle weakness at the site of the injection, or have myasthenia gravis, Lambert-Eaton syndrome, or ALS.  It is also possible that as with any injection, there may be an allergic reaction or no effect from the medication. Reduced effectiveness after repeated injections is sometimes seen and rarely infection at the injection site may occur. All care will be taken to prevent these side effects. If therapy is given over a long time, atrophy and wasting in the muscle injected may occur. Occasionally the patient's become refractory to treatment because they develop antibodies to the toxin. In this event, therapy needs to be modified.  I have read the above information and consent to the administration of botulism toxin.    BOTOX  PROCEDURE NOTE FOR MIGRAINE HEADACHE  Contraindications and precautions discussed with patient(above). Aseptic procedure was observed and patient tolerated procedure. Procedure performed by Harlene Bogaert, AGNP-BC.   The condition has existed for more than 6 months, and pt does not have a diagnosis of ALS, Myasthenia Gravis or Lambert-Eaton Syndrome.  Risks and benefits of injections discussed and pt agrees to proceed with the procedure.  Written consent obtained  These injections are medically necessary. Pt  receives good benefits from these injections. These injections do not cause sedations or hallucinations which the oral therapies may cause.   Description of procedure:  The patient was placed in a sitting position. The standard protocol was used for Botox  as follows, with 5  units of Botox  injected at each site:  -Procerus muscle, midline injection  -Corrugator muscle, bilateral injection  -Frontalis muscle, bilateral injection, with 2 sites each side, medial injection was performed in the upper one third of the  frontalis muscle, in the region vertical from the medial inferior edge of the superior orbital rim. The lateral injection was again in the upper one third of the forehead vertically above the lateral limbus of the cornea, 1.5 cm lateral to the medial injection site.  -Temporalis muscle injection, 4 sites, bilaterally. The first injection was 3 cm above the tragus of the ear, second injection site was 1.5 cm to 3 cm up from the first injection site in line with the tragus of the ear. The third injection site was 1.5-3 cm forward between the first 2 injection sites. The fourth injection site was 1.5 cm posterior to the second injection site. 5th site laterally in the temporalis  muscleat the level of the outer canthus.  -Occipitalis muscle injection, 3 sites, bilaterally. The first injection was done one half way between the occipital protuberance and the tip of the mastoid process behind the ear. The second injection site was done lateral and superior to the first, 1 fingerbreadth from the first injection. The third injection site was 1 fingerbreadth superiorly and medially from the first injection site.  -Cervical paraspinal muscle injection, 2 sites, bilaterally. The first injection site was 1 cm from the midline of the cervical spine, 3 cm inferior to the lower border of the occipital protuberance. The second injection site was 1.5 cm superiorly and laterally to the first injection site.  -Trapezius muscle injection was performed at 3 sites, bilaterally. The first injection site was in the upper trapezius muscle halfway between the inflection point of the neck, and the acromion. The second injection site was one half way between the acromion and the first injection site. The third injection was done between the first injection site and the inflection point of the neck.    A total of 200 units of Botox  was prepared, 155 units of Botox  was injected as documented above, any Botox  not injected was wasted.  The patient tolerated the procedure well, there were no complications of the above procedure.   Harlene Bogaert, AGNP-BC  [facility] [address] [place], [place] [address]  Phone [phone] Fax [phone] Note: This document was prepared with digital dictation and possible smart phrase technology. Any transcriptional errors that result from this process are unintentional.    "

## 2024-02-01 NOTE — Progress Notes (Signed)
 Botox - 200 units x 1 vial Lot: [id number] Expiration: 03/2026 NDC: 9976-6078-97   Bacteriostatic 0.9% Sodium Chloride - 4 mL  Lot: [id number] Expiration: 11/19/2024 NDC: 9590-8033-97    Dx: H56.290 Sp Witness:Diandra D

## 2024-02-19 ENCOUNTER — Ambulatory Visit

## 2024-02-19 DIAGNOSIS — G4710 Hypersomnia, unspecified: Secondary | ICD-10-CM

## 2024-04-08 ENCOUNTER — Encounter (HOSPITAL_BASED_OUTPATIENT_CLINIC_OR_DEPARTMENT_OTHER): Admitting: Pulmonary Disease

## 2024-04-27 ENCOUNTER — Ambulatory Visit: Admitting: Adult Health
